# Patient Record
Sex: Male | Born: 1945 | Race: White | Hispanic: No | Marital: Married | Smoking: Never smoker
Health system: Southern US, Community
[De-identification: ages and names within clinical notes are randomized; demographics above are authoritative.]

## PROBLEM LIST (undated history)

## (undated) DIAGNOSIS — F0390 Unspecified dementia without behavioral disturbance: Secondary | ICD-10-CM

## (undated) DIAGNOSIS — N289 Disorder of kidney and ureter, unspecified: Secondary | ICD-10-CM

## (undated) HISTORY — PX: EYE SURGERY: SHX253

## (undated) HISTORY — PX: APPENDECTOMY: SHX54

---

## 2000-07-26 ENCOUNTER — Observation Stay (HOSPITAL_COMMUNITY): Admission: RE | Admit: 2000-07-26 | Discharge: 2000-07-27 | Payer: Self-pay | Admitting: Orthopedic Surgery

## 2005-07-20 ENCOUNTER — Ambulatory Visit (HOSPITAL_COMMUNITY): Admission: RE | Admit: 2005-07-20 | Discharge: 2005-07-20 | Payer: Self-pay | Admitting: *Deleted

## 2010-02-08 ENCOUNTER — Encounter: Admission: RE | Admit: 2010-02-08 | Discharge: 2010-02-08 | Payer: Self-pay | Admitting: Family Medicine

## 2010-08-11 ENCOUNTER — Emergency Department (HOSPITAL_COMMUNITY): Admission: EM | Admit: 2010-08-11 | Discharge: 2010-08-11 | Payer: Self-pay | Admitting: Emergency Medicine

## 2010-10-19 ENCOUNTER — Encounter: Admission: RE | Admit: 2010-10-19 | Discharge: 2010-10-19 | Payer: Self-pay | Admitting: Gastroenterology

## 2011-03-02 LAB — DIFFERENTIAL
Basophils Absolute: 0 10*3/uL (ref 0.0–0.1)
Basophils Relative: 0 % (ref 0–1)
Eosinophils Absolute: 0.1 10*3/uL (ref 0.0–0.7)
Eosinophils Relative: 2 % (ref 0–5)
Lymphs Abs: 1.2 10*3/uL (ref 0.7–4.0)
Monocytes Absolute: 0.5 10*3/uL (ref 0.1–1.0)
Neutrophils Relative %: 75 % (ref 43–77)

## 2011-03-02 LAB — CBC
Hemoglobin: 16 g/dL (ref 13.0–17.0)
MCH: 31.8 pg (ref 26.0–34.0)
MCHC: 34.6 g/dL (ref 30.0–36.0)
MCV: 92 fL (ref 78.0–100.0)
Platelets: 155 10*3/uL (ref 150–400)
RBC: 5.03 MIL/uL (ref 4.22–5.81)
RDW: 13.3 % (ref 11.5–15.5)

## 2011-03-02 LAB — BASIC METABOLIC PANEL
BUN: 16 mg/dL (ref 6–23)
CO2: 30 mEq/L (ref 19–32)
Calcium: 9.3 mg/dL (ref 8.4–10.5)
Chloride: 108 mEq/L (ref 96–112)
Creatinine, Ser: 1.23 mg/dL (ref 0.4–1.5)
GFR calc Af Amer: >60 mL/min (ref >60)
Sodium: 142 mEq/L (ref 135–145)

## 2011-03-02 LAB — POCT CARDIAC MARKERS: Myoglobin, poc: 57 ng/mL (ref 12–200)

## 2011-05-04 NOTE — Op Note (Signed)
NAME:  ALAZAR, CHERIAN NO.:  [id number]   MEDICAL RECORD NO.:  [id number]          PATIENT TYPE:  AMB   LOCATION:  ENDO                         FACILITY:  [facility]   PHYSICIAN:  Georgiana Spinner, M.D.    DATE OF BIRTH:  October 07, 1946   DATE OF PROCEDURE:  07/20/2005  DATE OF DISCHARGE:                                 OPERATIVE REPORT   PROCEDURE:  Colonoscopy.   INDICATIONS:  Colon cancer screening.   ANESTHESIA:  Demerol 80, Versed 7 mg,   PROCEDURE:  With the patient mildly sedated in the left lateral decubitus  position, a rectal examination was performed which was unremarkable.  Subsequently, the Olympus videoscopic colonoscope was inserted into the  rectum and passed under direct vision to the cecum, identified by ileocecal  valve and appendiceal orifice both which were photographed.  From this  point, the colonoscope was slowly withdrawn taking circumferential views of  colonic mucosa stopping in the rectum which appeared normal on direct and  showed hemorrhoids on retroflexed view.  The endoscope was straightened,  withdrawn.  The patient's vital signs and pulse oximeter remained stable.  The patient tolerated the procedure well without apparent complications.   FINDINGS:  Internal hemorrhoids, otherwise unremarkable colonoscopic  examination to the cecum.   PLAN:  Repeat examination possibly in 5-10 years.       GMO/MEDQ  D:  07/20/2005  T:  07/20/2005  Job:  [id number]

## 2011-05-04 NOTE — Op Note (Signed)
[facility]  Patient:    Robert Hester, Robert Hester                      MRN: [id number] Proc. Date: 07/26/00 Adm. Date:  [id number] Attending:  Marlowe Kays Page                           Operative Report  PREOPERATIVE DIAGNOSIS:  Chronic impingement syndrome, right shoulder.  POSTOPERATIVE DIAGNOSIS:  Chronic impingement syndrome, right shoulder.  OPERATION PERFORMED: 1. Right shoulder arthroscopy (normal exam). 2. Arthroscopic SAD.  SURGEON:  Dr. Simonne Come.  ASSISTANT:  Grayland Jack, P.A.  ANESTHESIA:  General.  PATHOLOGY AND JUSTIFICATION FOR PROCEDURE:  He has had progressive persistent symptoms of impingement syndrome, right shoulder associated with an down sloping acromion and an MRI which shows some rotator cuff tendonopathy but no tear or glenohumeral abnormalities. 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  DESCRIPTION OF PROCEDURE:  Satisfactory general anesthesia, beach chair position on the Schlein frame, the right shoulder girdle was prepped with duraprep, draped in a sterile field and the anatomy of the shoulder was marked out and the posterior soft spot located and infiltrated with 0.5% Marcaine with adrenaline as was the subacromial space. I then inserted a blunt trocar atraumatically in the glenohumeral joint which I found to be normal. Representative pictures were taken of the labrum, humeral head, biceps tendon and rotator cuff. I then redirected the scope in the subacromial area and through a lateral portal which I infiltrated also with 0.5% Marcaine with adrenaline, inserted a blunt cannula into the subacromial space followed by a 4.2 shaver. He had a good bit of bursitis present which I gradually resected out with the shaver and shaved the underneath surface of the anterior acromion. I then took a preliminary picture with the shaver in place. I  then took the underwater cautery and cauterized around the perimeter of the acromion and followed this with additional shaving with a 4.2 shaver and then burring with a 4.0 bur. I went back and forth alternating between the shaver, the cautery and the bur until I had what I felt was a complete subacromial decompression. I took a final picture with the arm abducted and flexed with the shaver showing between the rotator cuff and the subacromial area and there was plenty of room. I then evacuated the subacromial joint of all fluid possible and infiltrated it with 0.5% Marcaine with adrenaline. The 2 portals were closed with 4-0 nylon followed by Betadine Adaptic dry sterile dressing and shoulder immobilizer. The patient tolerated the procedure well and was taken to the operating room in satisfactory condition with no known complications. DD:  07/26/00 TD:  07/27/00 Job: [id number]
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
## 2014-12-23 DIAGNOSIS — D225 Melanocytic nevi of trunk: Secondary | ICD-10-CM | POA: Diagnosis not present

## 2014-12-23 DIAGNOSIS — Z85820 Personal history of malignant melanoma of skin: Secondary | ICD-10-CM | POA: Diagnosis not present

## 2014-12-23 DIAGNOSIS — L821 Other seborrheic keratosis: Secondary | ICD-10-CM | POA: Diagnosis not present

## 2014-12-23 DIAGNOSIS — Z08 Encounter for follow-up examination after completed treatment for malignant neoplasm: Secondary | ICD-10-CM | POA: Diagnosis not present

## 2014-12-27 ENCOUNTER — Other Ambulatory Visit: Payer: Self-pay | Admitting: Family Medicine

## 2014-12-27 DIAGNOSIS — Z800 Family history of malignant neoplasm of digestive organs: Secondary | ICD-10-CM

## 2014-12-27 DIAGNOSIS — Q438 Other specified congenital malformations of intestine: Secondary | ICD-10-CM

## 2014-12-31 ENCOUNTER — Other Ambulatory Visit: Payer: Self-pay | Admitting: Family Medicine

## 2014-12-31 DIAGNOSIS — Z800 Family history of malignant neoplasm of digestive organs: Secondary | ICD-10-CM

## 2014-12-31 DIAGNOSIS — Q438 Other specified congenital malformations of intestine: Secondary | ICD-10-CM

## 2015-01-10 ENCOUNTER — Ambulatory Visit
Admission: RE | Admit: 2015-01-10 | Discharge: 2015-01-10 | Disposition: A | Discharge status: Home / Self Care | Payer: Commercial Managed Care - HMO | Source: Ambulatory Visit | Attending: Family Medicine | Admitting: Family Medicine

## 2015-01-10 DIAGNOSIS — Z800 Family history of malignant neoplasm of digestive organs: Secondary | ICD-10-CM

## 2015-01-10 DIAGNOSIS — Z1211 Encounter for screening for malignant neoplasm of colon: Secondary | ICD-10-CM | POA: Diagnosis not present

## 2015-01-10 DIAGNOSIS — Q438 Other specified congenital malformations of intestine: Secondary | ICD-10-CM

## 2015-01-10 DIAGNOSIS — K562 Volvulus: Secondary | ICD-10-CM | POA: Diagnosis not present

## 2015-02-10 DIAGNOSIS — K4090 Unilateral inguinal hernia, without obstruction or gangrene, not specified as recurrent: Secondary | ICD-10-CM | POA: Diagnosis not present

## 2015-06-27 DIAGNOSIS — L821 Other seborrheic keratosis: Secondary | ICD-10-CM | POA: Diagnosis not present

## 2015-06-27 DIAGNOSIS — D225 Melanocytic nevi of trunk: Secondary | ICD-10-CM | POA: Diagnosis not present

## 2015-06-27 DIAGNOSIS — L570 Actinic keratosis: Secondary | ICD-10-CM | POA: Diagnosis not present

## 2015-06-27 DIAGNOSIS — Z08 Encounter for follow-up examination after completed treatment for malignant neoplasm: Secondary | ICD-10-CM | POA: Diagnosis not present

## 2015-06-27 DIAGNOSIS — Z85820 Personal history of malignant melanoma of skin: Secondary | ICD-10-CM | POA: Diagnosis not present

## 2015-11-25 DIAGNOSIS — M545 Low back pain: Secondary | ICD-10-CM | POA: Diagnosis not present

## 2015-12-13 DIAGNOSIS — R1031 Right lower quadrant pain: Secondary | ICD-10-CM | POA: Diagnosis not present

## 2015-12-28 DIAGNOSIS — L812 Freckles: Secondary | ICD-10-CM | POA: Diagnosis not present

## 2015-12-28 DIAGNOSIS — Z85820 Personal history of malignant melanoma of skin: Secondary | ICD-10-CM | POA: Diagnosis not present

## 2015-12-28 DIAGNOSIS — L821 Other seborrheic keratosis: Secondary | ICD-10-CM | POA: Diagnosis not present

## 2015-12-28 DIAGNOSIS — D1801 Hemangioma of skin and subcutaneous tissue: Secondary | ICD-10-CM | POA: Diagnosis not present

## 2016-01-05 DIAGNOSIS — Z1211 Encounter for screening for malignant neoplasm of colon: Secondary | ICD-10-CM | POA: Diagnosis not present

## 2016-01-05 DIAGNOSIS — Z800 Family history of malignant neoplasm of digestive organs: Secondary | ICD-10-CM | POA: Diagnosis not present

## 2016-01-05 DIAGNOSIS — D126 Benign neoplasm of colon, unspecified: Secondary | ICD-10-CM | POA: Diagnosis not present

## 2016-01-05 DIAGNOSIS — D125 Benign neoplasm of sigmoid colon: Secondary | ICD-10-CM | POA: Diagnosis not present

## 2016-01-14 IMAGING — CT CT VIRTUAL COLONOSCOPY DIAGNOSTIC
3 of 7 series · 12 of 36 positions shown, 18 images · non-contrast
Comparison: Barium enema with air of 10/19/2010

CLINICAL DATA: Tortuous colon, screening for colorectal carcinoma,
strong family history of colorectal carcinoma, incomplete
colonoscopy in 4933 due to tortuous colon

EXAM:
CT VIRTUAL COLONOSCOPY DIAGNOSTIC
TECHNIQUE: The patient was given a standard magnesium citrate and suppositories
bowel preparation with Gastrografin and barium for fluid and stool
tagging respectively. The quality of the bowel preparation is
moderate. Automated CO2 insufflation of the colon was performed
prior to image acquisition and colonic distention is moderate. Image
post processing was used to generate a 3D endoluminal fly-through
projection of the colon and to electronically subtract stool/fluid
as appropriate.

[Series 2: supine (id) · axial · 0.79mm/px · z∈[-448,-226]mm · 4 of 415 slices shown]
[im 60/415  soft-tissue]
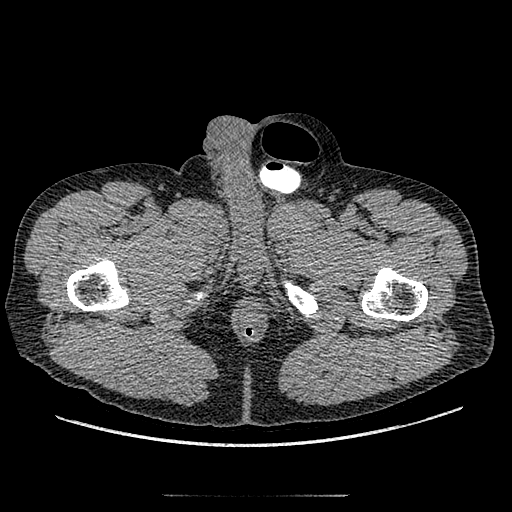
[im 119/415  soft-tissue]
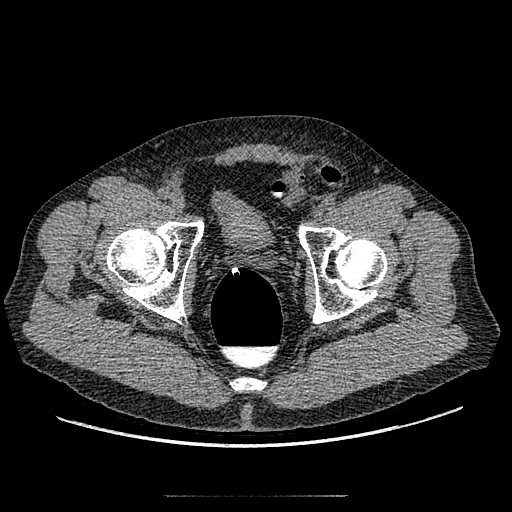
[im 178/415  soft-tissue]
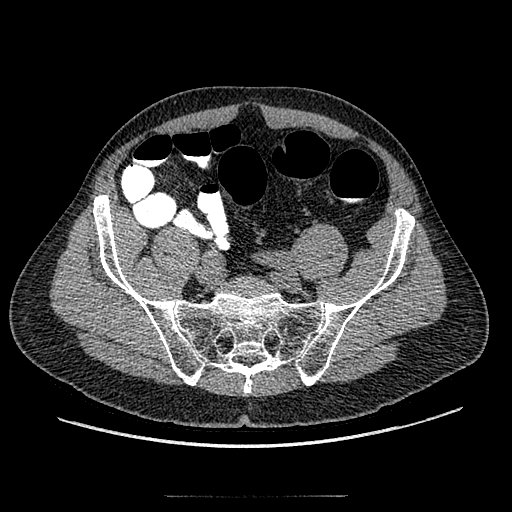
[im 237/415  soft-tissue]
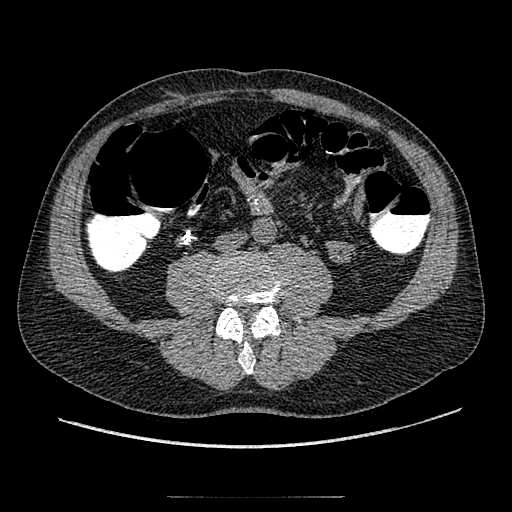

[Series 6: prone (id) · axial · 0.83mm/px · z∈[-436,-33]mm · 7 of 431 slices shown, 12 images]
[im 54/431  soft-tissue]
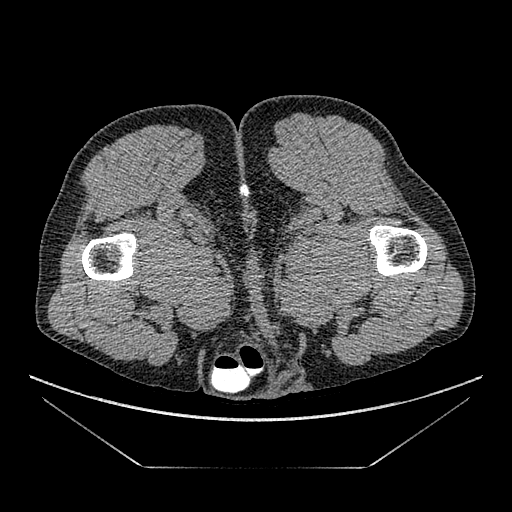
[im 54/431  bone]
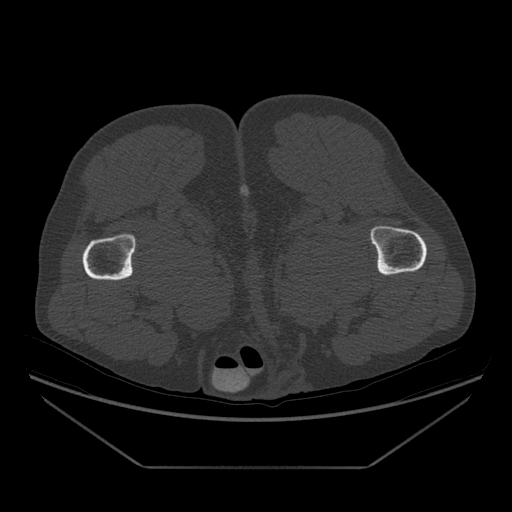
[im 108/431  soft-tissue]
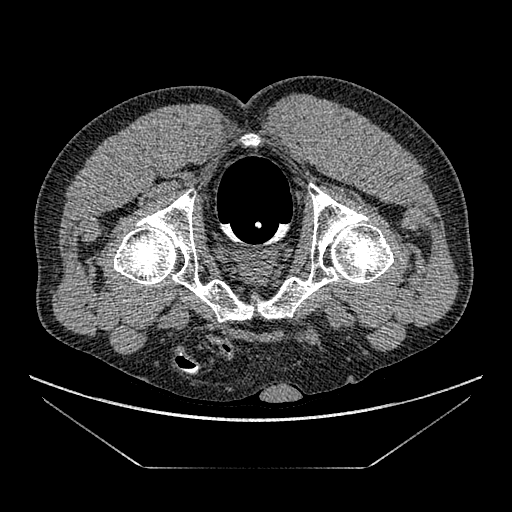
[im 162/431  soft-tissue]
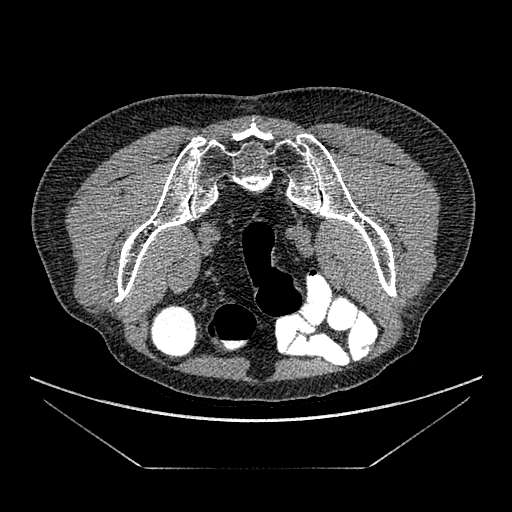
[im 216/431  soft-tissue]
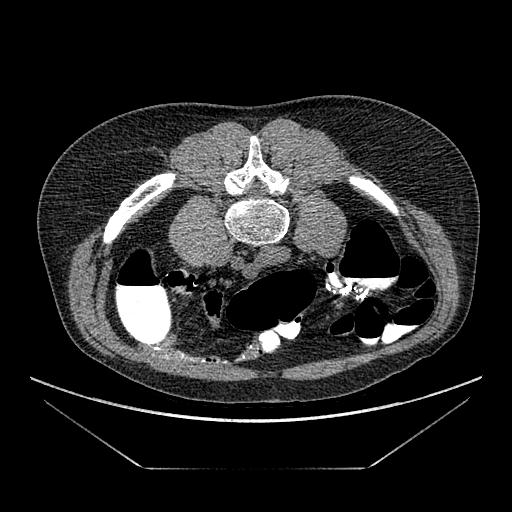
[im 216/431  lung]
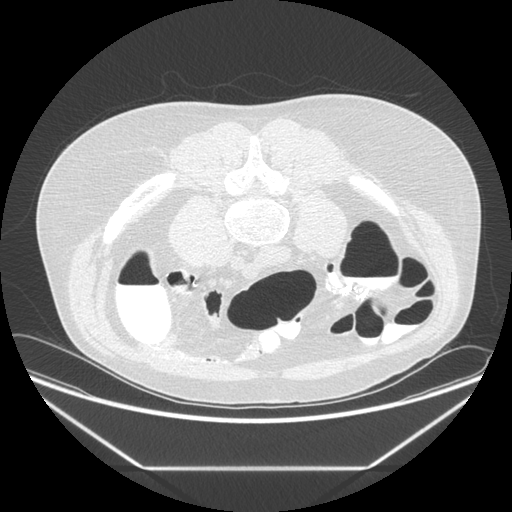
[im 269/431  soft-tissue]
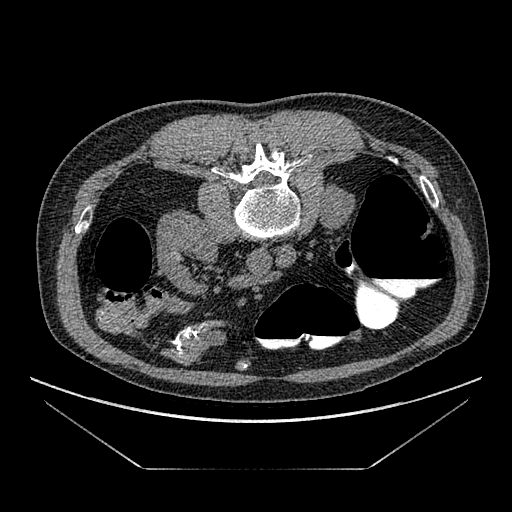
[im 269/431  lung]
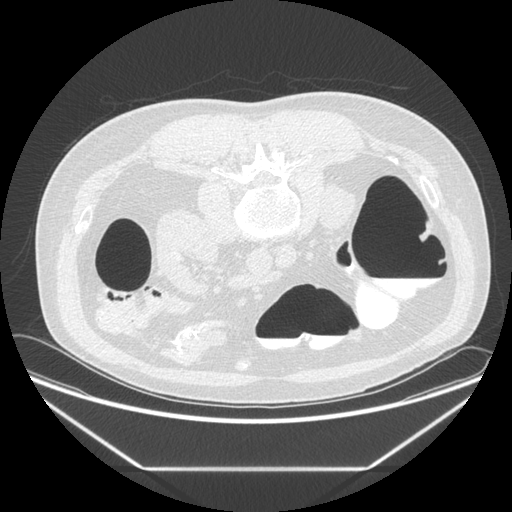
[im 323/431  soft-tissue]
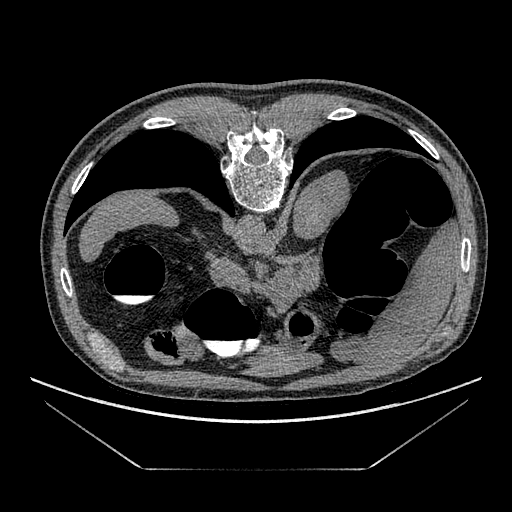
[im 323/431  lung]
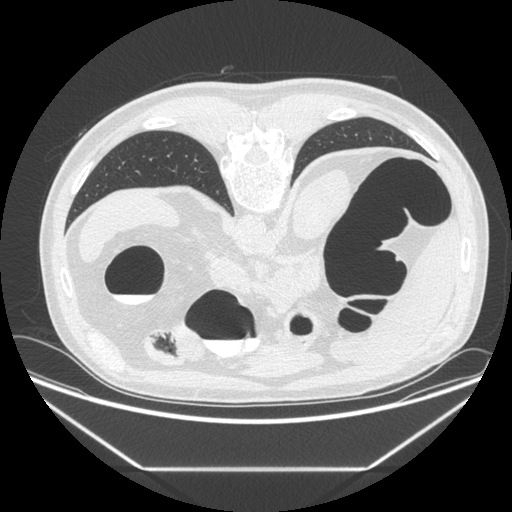
[im 377/431  soft-tissue]
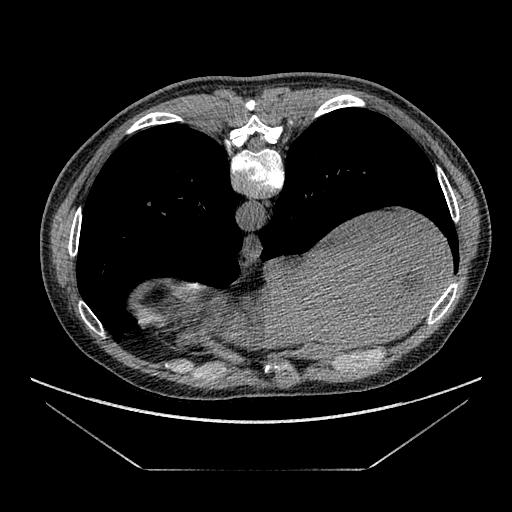
[im 377/431  lung]
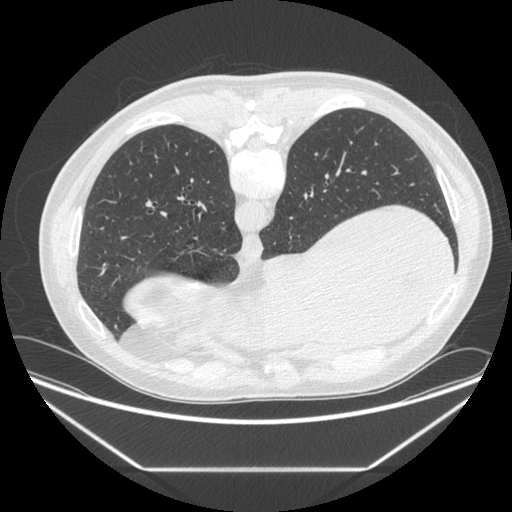

[Series 601: coronal body · coronal · 1.01mm/px · 1 of 120 slices shown, 2 images]
[im 40/120  soft-tissue]
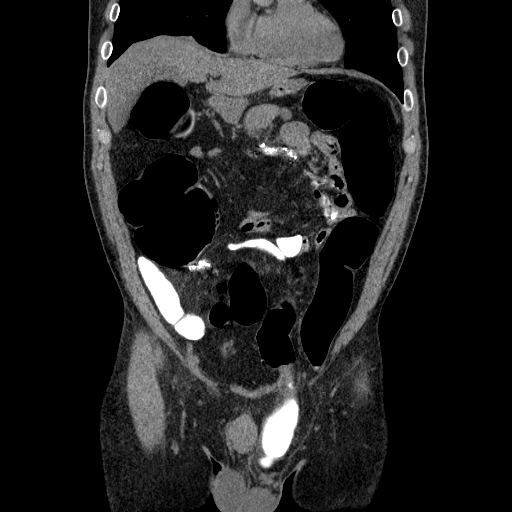
[im 40/120  bone]
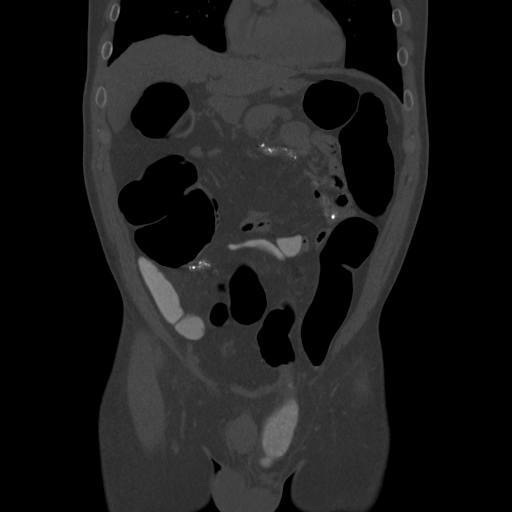

[12 of 36 positions shown; findings below may reference images not displayed]

FINDINGS: Both 2D and 3D images of the colon were performed and reviewed in
prone and supine positions. There is a moderate amount of retained
fluid which is well tagged. Very little retained feces is seen.
Although there are a few small polypoid lesions in the descending
and transverse colon of no more than 3-4 mm in diameter, no
clinically significant polypoid lesion is seen. There is a left
inguinal hernia containing a nondilated loop of the junction of the
distal descending and proximal sigmoid colon. This area is not
optimally visualized but no abnormal soft tissue is evident on the
images obtained. The cecum and ileocecal valve are well visualized
with no abnormality noted.
IMPRESSION: 1. No clinically significant polypoid lesion is seen. Only a few
scattered 3-4 mm polypoid lesions are noted in the descending and
transverse colon of no clinical significance.
2. Left inguinal hernia containing a nondilated loop of rectosigmoid
and distal descending colon.

Virtual colonoscopy is not designed to detect diminutive polyps
(i.e., less than or equal to 5 mm), the presence or absence of which
may not affect clinical management.

CT ABDOMEN AND PELVIS WITHOUT CONTRAST
FINDINGS: The lung bases are clear. There are a few
well-circumscribed low-attenuation liver lesions most consistent
with hepatic cysts, but difficult to assess on this unenhanced
study. No calcified gallstones are seen. The pancreas is
unremarkable in the unenhanced state. The adrenal glands and spleen
are unremarkable. The stomach is decompressed. No renal calculi are
seen and there is no evidence of hydronephrosis. No renal mass is
evident on this unenhanced study. The abdominal aorta is normal in
caliber. No adenopathy is seen.

The urinary bladder is decompressed and cannot be evaluated. Again a
left inguinal hernia is noted containing a nondilated loop of
rectosigmoid colon. The prostate is normal in size. No pelvic mass
or fluid is seen. The appendix is not definitely seen. The lumbar
vertebrae are in normal alignment. The SI joints appear corticated.

1. Left inguinal hernia containing a nondilated loop of rectosigmoid
colon.
2. Probable hepatic cysts, difficult to assess on this unenhanced
study.

## 2016-01-16 DIAGNOSIS — H5210 Myopia, unspecified eye: Secondary | ICD-10-CM | POA: Diagnosis not present

## 2016-01-16 DIAGNOSIS — H5203 Hypermetropia, bilateral: Secondary | ICD-10-CM | POA: Diagnosis not present

## 2016-02-03 DIAGNOSIS — Z13220 Encounter for screening for lipoid disorders: Secondary | ICD-10-CM | POA: Diagnosis not present

## 2016-02-03 DIAGNOSIS — Z1211 Encounter for screening for malignant neoplasm of colon: Secondary | ICD-10-CM | POA: Diagnosis not present

## 2016-02-03 DIAGNOSIS — R413 Other amnesia: Secondary | ICD-10-CM | POA: Diagnosis not present

## 2016-02-03 DIAGNOSIS — Z125 Encounter for screening for malignant neoplasm of prostate: Secondary | ICD-10-CM | POA: Diagnosis not present

## 2016-02-03 DIAGNOSIS — Z23 Encounter for immunization: Secondary | ICD-10-CM | POA: Diagnosis not present

## 2016-02-03 DIAGNOSIS — K4090 Unilateral inguinal hernia, without obstruction or gangrene, not specified as recurrent: Secondary | ICD-10-CM | POA: Diagnosis not present

## 2016-02-03 DIAGNOSIS — Z1389 Encounter for screening for other disorder: Secondary | ICD-10-CM | POA: Diagnosis not present

## 2016-02-03 DIAGNOSIS — Z20820 Contact with and (suspected) exposure to varicella: Secondary | ICD-10-CM | POA: Diagnosis not present

## 2016-02-03 DIAGNOSIS — Z0000 Encounter for general adult medical examination without abnormal findings: Secondary | ICD-10-CM | POA: Diagnosis not present

## 2016-02-10 DIAGNOSIS — Z20820 Contact with and (suspected) exposure to varicella: Secondary | ICD-10-CM | POA: Diagnosis not present

## 2016-02-10 DIAGNOSIS — R413 Other amnesia: Secondary | ICD-10-CM | POA: Diagnosis not present

## 2016-02-10 DIAGNOSIS — Z125 Encounter for screening for malignant neoplasm of prostate: Secondary | ICD-10-CM | POA: Diagnosis not present

## 2016-02-10 DIAGNOSIS — Z136 Encounter for screening for cardiovascular disorders: Secondary | ICD-10-CM | POA: Diagnosis not present

## 2016-03-05 ENCOUNTER — Encounter: Payer: Self-pay | Admitting: Neurology

## 2016-03-05 ENCOUNTER — Ambulatory Visit (INDEPENDENT_AMBULATORY_CARE_PROVIDER_SITE_OTHER): Payer: Commercial Managed Care - HMO | Admitting: Neurology

## 2016-03-05 ENCOUNTER — Telehealth: Payer: Self-pay

## 2016-03-05 ENCOUNTER — Other Ambulatory Visit (INDEPENDENT_AMBULATORY_CARE_PROVIDER_SITE_OTHER): Payer: Commercial Managed Care - HMO

## 2016-03-05 VITALS — BP 106/60 | HR 80 | Ht 68.0 in | Wt 191.0 lb

## 2016-03-05 DIAGNOSIS — R413 Other amnesia: Secondary | ICD-10-CM | POA: Diagnosis not present

## 2016-03-05 DIAGNOSIS — F419 Anxiety disorder, unspecified: Secondary | ICD-10-CM | POA: Diagnosis not present

## 2016-03-05 LAB — VITAMIN B12: Vitamin B-12: 349 pg/mL (ref 211–911)

## 2016-03-05 LAB — TSH: TSH: 2.65 u[IU]/mL (ref 0.35–4.50)
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
## 2016-03-05 NOTE — Patient Instructions (Signed)
1.  We will check MRI of brain as well as B12 and TSH 2.  I would recommend seeing a therapist regarding the stress 3.  I would monitor for now and recheck in 9 months.

## 2016-03-05 NOTE — Telephone Encounter (Signed)
Message relayed to patient. Verbalized understanding and denied questions.   

## 2016-03-05 NOTE — Progress Notes (Signed)
Chart forwarded.  

## 2016-03-05 NOTE — Telephone Encounter (Signed)
-----   Message from Pieter Partridge, DO sent at 03/05/2016  1:33 PM EDT ----- Labs are normal

## 2016-03-10 ENCOUNTER — Inpatient Hospital Stay: Admission: RE | Admit: 2016-03-10 | Payer: Commercial Managed Care - HMO | Source: Ambulatory Visit

## 2016-03-22 DIAGNOSIS — K4090 Unilateral inguinal hernia, without obstruction or gangrene, not specified as recurrent: Secondary | ICD-10-CM | POA: Diagnosis not present

## 2016-07-16 DIAGNOSIS — L814 Other melanin hyperpigmentation: Secondary | ICD-10-CM | POA: Diagnosis not present

## 2016-07-16 DIAGNOSIS — B351 Tinea unguium: Secondary | ICD-10-CM | POA: Diagnosis not present

## 2016-07-16 DIAGNOSIS — Z85820 Personal history of malignant melanoma of skin: Secondary | ICD-10-CM | POA: Diagnosis not present

## 2016-07-16 DIAGNOSIS — D235 Other benign neoplasm of skin of trunk: Secondary | ICD-10-CM | POA: Diagnosis not present

## 2016-07-16 DIAGNOSIS — L821 Other seborrheic keratosis: Secondary | ICD-10-CM | POA: Diagnosis not present

## 2016-10-18 DIAGNOSIS — K4090 Unilateral inguinal hernia, without obstruction or gangrene, not specified as recurrent: Secondary | ICD-10-CM | POA: Diagnosis not present

## 2016-12-05 ENCOUNTER — Encounter: Payer: Self-pay | Admitting: Neurology

## 2016-12-05 ENCOUNTER — Ambulatory Visit (INDEPENDENT_AMBULATORY_CARE_PROVIDER_SITE_OTHER): Payer: Commercial Managed Care - HMO | Admitting: Neurology

## 2016-12-05 VITALS — BP 132/68 | HR 84 | Ht 68.0 in | Wt 190.0 lb

## 2016-12-05 DIAGNOSIS — R413 Other amnesia: Secondary | ICD-10-CM | POA: Diagnosis not present

## 2016-12-05 NOTE — Progress Notes (Signed)
Chart forwarded.  

## 2016-12-05 NOTE — Patient Instructions (Signed)
1.  We will set you up for neurocognitive testing.  Follow up afterwards 2.  Recommend Mediterranean diet  Mediterranean Diet A Mediterranean diet refers to food and lifestyle choices that are based on the traditions of countries located on the [employer]. This way of eating has been shown to help prevent certain conditions and improve outcomes for people who have chronic diseases, like kidney disease and heart disease. What are tips for following this plan? Lifestyle  Cook and eat meals together with your family, when possible.  Drink enough fluid to keep your urine clear or pale yellow.  Be physically active every day. This includes:  Aerobic exercise like running or swimming.  Leisure activities like gardening, walking, or housework.  Get 7-8 hours of sleep each night.  If recommended by your health care provider, drink red wine in moderation. This means 1 glass a day for nonpregnant women and 2 glasses a day for men. A glass of wine equals 5 oz (150 mL). Reading food labels  Check the serving size of packaged foods. For foods such as rice and pasta, the serving size refers to the amount of cooked product, not dry.  Check the total fat in packaged foods. Avoid foods that have saturated fat or trans fats.  Check the ingredients list for added sugars, such as corn syrup. Shopping  At the grocery store, buy most of your food from the areas near the walls of the store. This includes:  Fresh fruits and vegetables (produce).  Grains, beans, nuts, and seeds. Some of these may be available in unpackaged forms or large amounts (in bulk).  Fresh seafood.  Poultry and eggs.  Low-fat dairy products.  Buy whole ingredients instead of prepackaged foods.  Buy fresh fruits and vegetables in-season from local farmers markets.  Buy frozen fruits and vegetables in resealable bags.  If you do not have access to quality fresh seafood, buy precooked frozen shrimp or canned fish,  such as tuna, salmon, or sardines.  Buy small amounts of raw or cooked vegetables, salads, or olives from the deli or salad bar at your store.  Stock your pantry so you always have certain foods on hand, such as olive oil, canned tuna, canned tomatoes, rice, pasta, and beans. Cooking  Cook foods with extra-virgin olive oil instead of using butter or other vegetable oils.  Have meat as a side dish, and have vegetables or grains as your main dish. This means having meat in small portions or adding small amounts of meat to foods like pasta or stew.  Use beans or vegetables instead of meat in common dishes like chili or lasagna.  Experiment with different cooking methods. Try roasting or broiling vegetables instead of steaming or sauteing them.  Add frozen vegetables to soups, stews, pasta, or rice.  Add nuts or seeds for added healthy fat at each meal. You can add these to yogurt, salads, or vegetable dishes.  Marinate fish or vegetables using olive oil, lemon juice, garlic, and fresh herbs. Meal planning  Plan to eat 1 vegetarian meal one day each week. Try to work up to 2 vegetarian meals, if possible.  Eat seafood 2 or more times a week.  Have healthy snacks readily available, such as:  Vegetable sticks with hummus.  Greek yogurt.  Fruit and nut trail mix.  Eat balanced meals throughout the week. This includes:  Fruit: 2-3 servings a day  Vegetables: 4-5 servings a day  Low-fat dairy: 2 servings a day  Fish,  poultry, or lean meat: 1 serving a day  Beans and legumes: 2 or more servings a week  Nuts and seeds: 1-2 servings a day  Whole grains: 6-8 servings a day  Extra-virgin olive oil: 3-4 servings a day  Limit red meat and sweets to only a few servings a month What are my food choices?  Mediterranean diet  Recommended  Grains: Whole-grain pasta. Brown rice. Bulgar wheat. Polenta. Couscous. Whole-wheat bread. [place].  Vegetables: Artichokes.  Beets. Broccoli. Cabbage. Carrots. Eggplant. Green beans. Chard. Kale. Spinach. Onions. Leeks. Peas. Squash. Tomatoes. Peppers. Radishes.  Fruits: Apples. Apricots. Avocado. Berries. Bananas. Cherries. Dates. Figs. Grapes. Lemons. Melon. Oranges. Peaches. Plums. Pomegranate.  Meats and other protein foods: Beans. Almonds. Sunflower seeds. Pine nuts. Peanuts. [place]. Salmon. Scallops. Shrimp. [place]. Tilapia. Clams. Oysters. Eggs.  Dairy: Low-fat milk. Cheese. Greek yogurt.  Beverages: Water. Red wine. Herbal tea.  Fats and oils: Extra virgin olive oil. Avocado oil. Grape seed oil.  Sweets and desserts: [place] yogurt with honey. Baked apples. Poached pears. Trail mix.  Seasoning and other foods: Basil. Cilantro. Coriander. Cumin. Mint. Parsley. Sage. Rosemary. Tarragon. Garlic. Oregano. Thyme. Pepper. Balsalmic vinegar. Tahini. Hummus. Tomato sauce. Olives. Mushrooms.  Limit these  Grains: Prepackaged pasta or rice dishes. Prepackaged cereal with added sugar.  Vegetables: Deep fried potatoes (french fries).  Fruits: Fruit canned in syrup.  Meats and other protein foods: Beef. Pork. Lamb. Poultry with skin. Hot dogs. Berniece Salines.  Dairy: Ice cream. Sour cream. Whole milk.  Beverages: Juice. Sugar-sweetened soft drinks. Beer. Liquor and spirits.  Fats and oils: Butter. Canola oil. Vegetable oil. Beef fat (tallow). Lard.  Sweets and desserts: Cookies. Cakes. Pies. Candy.  Seasoning and other foods: Mayonnaise. Premade sauces and marinades.  The items listed may not be a complete list. Talk with your dietitian about what dietary choices are right for you. Summary  The Mediterranean diet includes both food and lifestyle choices.  Eat a variety of fresh fruits and vegetables, beans, nuts, seeds, and whole grains.  Limit the amount of red meat and sweets that you eat.  Talk with your health care provider about whether it is safe for you to drink red wine in moderation. This means 1 glass a  day for nonpregnant women and 2 glasses a day for men. A glass of wine equals 5 oz (150 mL). This information is not intended to replace advice given to you by your health care provider. Make sure you discuss any questions you have with your health care provider. Document Released: 07/26/2016 Document Revised: 08/28/2016 Document Reviewed: 07/26/2016 Elsevier Interactive Patient Education  2017 [employer].
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
## 2017-01-21 ENCOUNTER — Ambulatory Visit (INDEPENDENT_AMBULATORY_CARE_PROVIDER_SITE_OTHER): Payer: Medicare HMO | Admitting: Psychology

## 2017-01-21 ENCOUNTER — Encounter: Payer: Self-pay | Admitting: Psychology

## 2017-01-21 DIAGNOSIS — R413 Other amnesia: Secondary | ICD-10-CM

## 2017-01-21 DIAGNOSIS — H524 Presbyopia: Secondary | ICD-10-CM | POA: Diagnosis not present
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
## 2017-01-30 ENCOUNTER — Ambulatory Visit (INDEPENDENT_AMBULATORY_CARE_PROVIDER_SITE_OTHER): Payer: Medicare HMO | Admitting: Psychology

## 2017-01-30 DIAGNOSIS — R413 Other amnesia: Secondary | ICD-10-CM

## 2017-01-30 NOTE — Progress Notes (Signed)
   Neuropsychology Note  Robert Hester returned today for 2 hours of neuropsychological testing with technician, Milana Kidney, BS, under the supervision of Dr. Macarthur Critchley. The patient did not appear overtly distressed by the testing session, per behavioral observation or via self-report to the technician. Rest breaks were offered. Robert Hester will return within 2 weeks for a feedback session with Dr. Si Raider at which time his test performances, clinical impressions and treatment recommendations will be reviewed in detail. The patient understands he can contact our office should he require our assistance before this time.  Full report to follow.

## 2017-02-08 DIAGNOSIS — Z23 Encounter for immunization: Secondary | ICD-10-CM | POA: Diagnosis not present

## 2017-02-08 DIAGNOSIS — Z13220 Encounter for screening for lipoid disorders: Secondary | ICD-10-CM | POA: Diagnosis not present

## 2017-02-08 DIAGNOSIS — Z1389 Encounter for screening for other disorder: Secondary | ICD-10-CM | POA: Diagnosis not present

## 2017-02-08 DIAGNOSIS — R413 Other amnesia: Secondary | ICD-10-CM | POA: Diagnosis not present

## 2017-02-08 DIAGNOSIS — Z0000 Encounter for general adult medical examination without abnormal findings: Secondary | ICD-10-CM | POA: Diagnosis not present

## 2017-02-08 DIAGNOSIS — Z1211 Encounter for screening for malignant neoplasm of colon: Secondary | ICD-10-CM | POA: Diagnosis not present

## 2017-02-08 DIAGNOSIS — Z131 Encounter for screening for diabetes mellitus: Secondary | ICD-10-CM | POA: Diagnosis not present

## 2017-02-08 DIAGNOSIS — Z125 Encounter for screening for malignant neoplasm of prostate: Secondary | ICD-10-CM | POA: Diagnosis not present
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
## 2017-02-19 ENCOUNTER — Encounter: Payer: Self-pay | Admitting: Psychology

## 2017-02-19 ENCOUNTER — Ambulatory Visit (INDEPENDENT_AMBULATORY_CARE_PROVIDER_SITE_OTHER): Payer: Medicare HMO | Admitting: Psychology

## 2017-02-19 DIAGNOSIS — R413 Other amnesia: Secondary | ICD-10-CM | POA: Diagnosis not present

## 2017-02-19 DIAGNOSIS — G3184 Mild cognitive impairment, so stated: Secondary | ICD-10-CM

## 2017-02-19 NOTE — Patient Instructions (Signed)
Results of your cognitive evaluation were largely within normal limits for your age, with some areas far exceeding the average range. However, you did have some difficulty with learning and remembering new verbal information. Your test results and current level of functioning clearly do NOT warrant a diagnosis of dementia. However, a diagnosis of Mild Cognitive Impairment (MCI) is appropriate at this time. I have given you written information on MCI.   I have the following recommendations:  --Continue cardiovascular exercise, mental stimulation and social interaction for brain health. You are doing an amazing job maintaining these activities! --Compensatory strategies for memory deficits (e.g., reminders/alerts, calendars, daily logs) will be helpful. Visual memory is better than auditory memory, per your testing results. --Reduced hearing may contribute to inability to encode and recall information. If you do no already wear hearing aids, you may want to see an audiologist. --You could consider starting a medication to help stabilize memory function and reduce the risk of decline. You can talk to Dr. Tomi Likens about this. --I'd like to see you back in one year for re-evaluation so we can monitor over time.

## 2017-02-26 ENCOUNTER — Encounter: Payer: Self-pay | Admitting: Neurology

## 2017-02-26 ENCOUNTER — Other Ambulatory Visit (INDEPENDENT_AMBULATORY_CARE_PROVIDER_SITE_OTHER): Payer: Medicare HMO

## 2017-02-26 ENCOUNTER — Ambulatory Visit (INDEPENDENT_AMBULATORY_CARE_PROVIDER_SITE_OTHER): Payer: Medicare HMO | Admitting: Neurology

## 2017-02-26 VITALS — BP 110/62 | HR 61 | Ht 68.0 in | Wt 190.9 lb

## 2017-02-26 DIAGNOSIS — G3184 Mild cognitive impairment, so stated: Secondary | ICD-10-CM

## 2017-02-26 LAB — VITAMIN B12: Vitamin B-12: 553 pg/mL (ref 211–911)
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
## 2017-02-26 NOTE — Patient Instructions (Signed)
1.  We will check MRI of brain and repeat vitamin B12 level 2.  Continue learning new information (reading the paper/news, documentaries) 3.  Mediterranean diet 4.  Continue exercising 5.  Consider starting donepezil (Aricept) to help with memory 6.  Follow up in 9 months or as needed.

## 2017-02-27 ENCOUNTER — Telehealth: Payer: Self-pay

## 2017-02-27 NOTE — Telephone Encounter (Signed)
-----   Message from Pieter Partridge, DO sent at 02/26/2017  6:48 PM EDT ----- B12 level is ok

## 2017-02-27 NOTE — Telephone Encounter (Signed)
Called patient. Gave lab results. Patient verbalized understanding.  

## 2017-03-07 ENCOUNTER — Other Ambulatory Visit: Payer: Medicare HMO

## 2017-04-01 DIAGNOSIS — H6120 Impacted cerumen, unspecified ear: Secondary | ICD-10-CM | POA: Diagnosis not present

## 2017-07-15 DIAGNOSIS — D692 Other nonthrombocytopenic purpura: Secondary | ICD-10-CM | POA: Diagnosis not present

## 2017-07-15 DIAGNOSIS — B078 Other viral warts: Secondary | ICD-10-CM | POA: Diagnosis not present

## 2017-07-15 DIAGNOSIS — L821 Other seborrheic keratosis: Secondary | ICD-10-CM | POA: Diagnosis not present

## 2017-07-15 DIAGNOSIS — Z85820 Personal history of malignant melanoma of skin: Secondary | ICD-10-CM | POA: Diagnosis not present

## 2017-07-15 DIAGNOSIS — D225 Melanocytic nevi of trunk: Secondary | ICD-10-CM | POA: Diagnosis not present

## 2017-07-15 DIAGNOSIS — D1801 Hemangioma of skin and subcutaneous tissue: Secondary | ICD-10-CM | POA: Diagnosis not present

## 2017-11-29 ENCOUNTER — Ambulatory Visit: Payer: Medicare HMO | Admitting: Neurology

## 2017-12-30 DIAGNOSIS — H524 Presbyopia: Secondary | ICD-10-CM | POA: Diagnosis not present

## 2018-02-10 DIAGNOSIS — Z1159 Encounter for screening for other viral diseases: Secondary | ICD-10-CM | POA: Diagnosis not present

## 2018-02-10 DIAGNOSIS — R413 Other amnesia: Secondary | ICD-10-CM | POA: Diagnosis not present

## 2018-02-10 DIAGNOSIS — Z0000 Encounter for general adult medical examination without abnormal findings: Secondary | ICD-10-CM | POA: Diagnosis not present

## 2018-02-10 DIAGNOSIS — Z125 Encounter for screening for malignant neoplasm of prostate: Secondary | ICD-10-CM | POA: Diagnosis not present

## 2018-02-10 DIAGNOSIS — H02883 Meibomian gland dysfunction of right eye, unspecified eyelid: Secondary | ICD-10-CM | POA: Diagnosis not present

## 2018-02-10 DIAGNOSIS — K4090 Unilateral inguinal hernia, without obstruction or gangrene, not specified as recurrent: Secondary | ICD-10-CM | POA: Diagnosis not present

## 2018-02-10 DIAGNOSIS — D696 Thrombocytopenia, unspecified: Secondary | ICD-10-CM | POA: Diagnosis not present

## 2018-02-10 DIAGNOSIS — H02885 Meibomian gland dysfunction left lower eyelid: Secondary | ICD-10-CM | POA: Diagnosis not present

## 2018-02-17 ENCOUNTER — Ambulatory Visit: Payer: Medicare HMO | Admitting: Psychology

## 2018-02-17 DIAGNOSIS — Z13220 Encounter for screening for lipoid disorders: Secondary | ICD-10-CM | POA: Diagnosis not present

## 2018-02-17 DIAGNOSIS — Z136 Encounter for screening for cardiovascular disorders: Secondary | ICD-10-CM | POA: Diagnosis not present

## 2018-02-17 DIAGNOSIS — D696 Thrombocytopenia, unspecified: Secondary | ICD-10-CM | POA: Diagnosis not present

## 2018-02-20 ENCOUNTER — Ambulatory Visit: Payer: Medicare HMO | Admitting: Neurology

## 2018-03-03 ENCOUNTER — Ambulatory Visit (INDEPENDENT_AMBULATORY_CARE_PROVIDER_SITE_OTHER): Payer: Medicare HMO | Admitting: Neurology

## 2018-03-03 ENCOUNTER — Encounter: Payer: Self-pay | Admitting: Neurology

## 2018-03-03 VITALS — BP 108/72 | HR 69 | Resp 16 | Ht 68.0 in | Wt 189.0 lb

## 2018-03-03 DIAGNOSIS — G301 Alzheimer's disease with late onset: Secondary | ICD-10-CM | POA: Diagnosis not present

## 2018-03-03 DIAGNOSIS — F0280 Dementia in other diseases classified elsewhere without behavioral disturbance: Secondary | ICD-10-CM | POA: Diagnosis not present

## 2018-03-03 MED ORDER — DONEPEZIL HCL 5 MG PO TABS: 5.0000 mg | ORAL_TABLET | Freq: Every day | ORAL | 0 refills | Status: DC

## 2018-03-03 NOTE — Patient Instructions (Addendum)
1. We will start donepezil (Aricept) 5mg  daily for four weeks.  If you are tolerating the medication, then after four weeks, we will increase the dose to 10mg  daily.  Side effects include nausea, vomiting, diarrhea, vivid dreams, and muscle cramps.  Please call the clinic if you experience any of these symptoms. 2.  Follow up with Dr. Si Raider as scheduled and then with me afterward. 3.  In the meantime, we will need to get MRI of brain
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
## 2018-03-12 ENCOUNTER — Telehealth: Payer: Self-pay | Admitting: Neurology

## 2018-03-12 DIAGNOSIS — H02883 Meibomian gland dysfunction of right eye, unspecified eyelid: Secondary | ICD-10-CM | POA: Diagnosis not present

## 2018-03-12 DIAGNOSIS — H02885 Meibomian gland dysfunction left lower eyelid: Secondary | ICD-10-CM | POA: Diagnosis not present

## 2018-03-12 NOTE — Telephone Encounter (Signed)
Sent through system fax

## 2018-03-12 NOTE — Telephone Encounter (Signed)
Robert Hester with [facility] (Dr. Laqueta Linden office) she is needing faxed [phone] his office note from last visit 03/03/18. Thanks

## 2018-04-10 ENCOUNTER — Encounter: Payer: Self-pay | Admitting: Neurology

## 2018-05-27 ENCOUNTER — Ambulatory Visit: Payer: Medicare HMO | Admitting: Neurology

## 2018-05-27 ENCOUNTER — Encounter

## 2018-07-14 ENCOUNTER — Ambulatory Visit: Payer: Medicare HMO | Admitting: Psychology

## 2018-07-14 ENCOUNTER — Encounter: Payer: Self-pay | Admitting: Psychology

## 2018-07-14 ENCOUNTER — Ambulatory Visit (INDEPENDENT_AMBULATORY_CARE_PROVIDER_SITE_OTHER): Payer: Medicare HMO | Admitting: Psychology

## 2018-07-14 DIAGNOSIS — G3184 Mild cognitive impairment, so stated: Secondary | ICD-10-CM

## 2018-07-14 DIAGNOSIS — R413 Other amnesia: Secondary | ICD-10-CM

## 2018-07-14 NOTE — Progress Notes (Signed)
   Neuropsychology Note  Robert Hester completed 60 minutes of neuropsychological testing with technician, Milana Kidney, BS, under the supervision of Dr. Macarthur Critchley, Licensed Psychologist. The patient did not appear overtly distressed by the testing session, per behavioral observation or via self-report to the technician. Rest breaks were offered.   Clinical Decision Making: In considering the patient's current level of functioning, level of presumed impairment, nature of symptoms, emotional and behavioral responses during the interview, level of literacy, and observed level of motivation/effort, a battery of tests was selected and communicated to the psychometrician.  Communication between the psychologist and technician was ongoing throughout the testing session and changes were made as deemed necessary based on patient performance on testing, technician observations and additional pertinent factors such as those listed above.  Robert Hester will return within approximately 2 weeks for an interactive feedback session with Dr. Si Raider at which time his test performances, clinical impressions and treatment recommendations will be reviewed in detail. The patient understands he can contact our office should he require our assistance before this time.  35 minutes spent performing neuropsychological evaluation services/clinical decision making (psychologist). [CPT 34196] 60 minutes spent face-to-face with patient administering standardized tests, 30 minutes spent scoring (technician). [CPT [id number], 22297]  Full report to follow.
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
## 2018-07-16 DIAGNOSIS — Z85820 Personal history of malignant melanoma of skin: Secondary | ICD-10-CM | POA: Diagnosis not present

## 2018-07-16 DIAGNOSIS — L814 Other melanin hyperpigmentation: Secondary | ICD-10-CM | POA: Diagnosis not present

## 2018-07-16 DIAGNOSIS — L570 Actinic keratosis: Secondary | ICD-10-CM | POA: Diagnosis not present

## 2018-07-16 DIAGNOSIS — D229 Melanocytic nevi, unspecified: Secondary | ICD-10-CM | POA: Diagnosis not present

## 2018-07-16 DIAGNOSIS — L821 Other seborrheic keratosis: Secondary | ICD-10-CM | POA: Diagnosis not present

## 2018-07-29 ENCOUNTER — Encounter: Payer: Medicare HMO | Admitting: Psychology
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
## 2018-08-04 ENCOUNTER — Ambulatory Visit (INDEPENDENT_AMBULATORY_CARE_PROVIDER_SITE_OTHER): Payer: Medicare HMO | Admitting: Neurology

## 2018-08-04 ENCOUNTER — Encounter: Payer: Self-pay | Admitting: Neurology

## 2018-08-04 ENCOUNTER — Telehealth: Payer: Self-pay | Admitting: Neurology

## 2018-08-04 VITALS — BP 108/72 | HR 67 | Ht 68.0 in | Wt 180.0 lb

## 2018-08-04 DIAGNOSIS — G3184 Mild cognitive impairment, so stated: Secondary | ICD-10-CM

## 2018-08-04 MED ORDER — DONEPEZIL HCL 5 MG PO TABS: 5.0000 mg | ORAL_TABLET | Freq: Every day | ORAL | 0 refills | Status: DC

## 2018-08-04 NOTE — Addendum Note (Signed)
Addended by: Clois Comber on: 08/04/2018 04:19 PM   Modules accepted: Orders

## 2018-08-04 NOTE — Patient Instructions (Signed)
1. Take donepezil 5mg  at bedtime.  At the end of the month, contact me and I will refill the prescription with 10mg  at bedtime 2.  Follow up with Dr. Si Raider 3.  Follow up with me in 6 months (sooner if testing reveals anything new or unexpected) 4.  Let me know when you would like to order MRI of brain

## 2018-08-04 NOTE — Telephone Encounter (Signed)
Patient's wife called and left a voicemail stating that the MRI orders from March are inactive. She stated that her husband came in this morning and MRI testing was ordered for him. If you could please put in another authorization for the MRI orders to [facility]. If you need to call her its [phone]. Thanks.

## 2018-08-05 ENCOUNTER — Telehealth: Payer: Self-pay | Admitting: Neurology

## 2018-08-05 NOTE — Telephone Encounter (Signed)
Patient is needing another authorization done for MRI that Dr.Jaffe requested. Also, patient had questions regarding ARICEPT medication. Prefer the generic if possible. Wanting to know if Dr. Durene Cal to pharmacy. [facility] at [address] Can you please call them at [phone] or [phone]. Thanks.
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
## 2018-08-06 NOTE — Telephone Encounter (Signed)
Have attempted to reach Pt at [phone], it rings many times then sounds like a fax machine. I tried the [phone], but the VM is full and I am unable to leave a message. The Aricept was sent in to [facility] on [address] and is available in a generic, donepezil. The PA department is working on getting the MRI approved.

## 2018-08-07 ENCOUNTER — Encounter: Payer: Self-pay | Admitting: Psychology

## 2018-08-07 ENCOUNTER — Ambulatory Visit (INDEPENDENT_AMBULATORY_CARE_PROVIDER_SITE_OTHER): Payer: Medicare HMO | Admitting: Psychology

## 2018-08-07 ENCOUNTER — Telehealth: Payer: Self-pay

## 2018-08-07 ENCOUNTER — Encounter

## 2018-08-07 DIAGNOSIS — F0280 Dementia in other diseases classified elsewhere without behavioral disturbance: Secondary | ICD-10-CM | POA: Diagnosis not present

## 2018-08-07 DIAGNOSIS — G301 Alzheimer's disease with late onset: Secondary | ICD-10-CM

## 2018-08-07 NOTE — Telephone Encounter (Signed)
-----   Message from Kandis Nab, PsyD sent at 08/07/2018  1:28 PM EDT ----- Regarding: Rx and MRI I saw this patient for follow-up to review re-evaluation results today. He states that his pharmacy did not get the script for donepezil (however, he cannot be sure about this, due to his memory problem). I advised that I see it was ordered, and they confirmed it is the right pharmacy. They are going to check at the pharmacy but if it's not there they request it be sent again.  His wife states she found out it would only be a $35 copay for brain MRI so they'd like to go ahead and do that. I advised I see order was placed. The patient does not think he has been called (but again, he has significant memory loss). His wife is requesting she be called to schedule that appt. Her cell is [phone].  Thank you!

## 2018-08-07 NOTE — Telephone Encounter (Signed)
[facility], spoke with Elmo Putt, she confirmed Aricept was filled on 08/04/18 and is ready for pick up.  Called [facility], spoke with Lindley Magnus, provided her with Pt's wife's call phone number to call and schedule MRI.

## 2018-09-09 ENCOUNTER — Telehealth: Payer: Self-pay | Admitting: Neurology

## 2018-09-09 DIAGNOSIS — F0280 Dementia in other diseases classified elsewhere without behavioral disturbance: Secondary | ICD-10-CM

## 2018-09-09 DIAGNOSIS — G301 Alzheimer's disease with late onset: Principal | ICD-10-CM

## 2018-09-09 MED ORDER — DONEPEZIL HCL 10 MG PO TABS: 10.0000 mg | ORAL_TABLET | Freq: Every day | ORAL | 5 refills | Status: DC

## 2018-09-09 NOTE — Telephone Encounter (Signed)
Patient is calling in to get a prescription sent in for his Donepezil medication. He is still using the [facility] on [address]. He said that the medication was working well and if needed for an increase in the medication he was okay with that. The doctor just wanted him to try it out and see how it did. If you need to call him back it's [phone]. Thanks!

## 2018-09-09 NOTE — Telephone Encounter (Signed)
Called and spoke with Pt's wife, Katharine Look. Pt has been tolerating donepezil 5 mg. Am sending in Rx for 10 mg.

## 2018-12-18 ENCOUNTER — Other Ambulatory Visit: Payer: Self-pay | Admitting: Neurology

## 2018-12-18 DIAGNOSIS — F0280 Dementia in other diseases classified elsewhere without behavioral disturbance: Secondary | ICD-10-CM

## 2018-12-18 DIAGNOSIS — G301 Alzheimer's disease with late onset: Principal | ICD-10-CM

## 2019-02-04 ENCOUNTER — Ambulatory Visit: Payer: Medicare HMO | Admitting: Neurology

## 2019-05-29 ENCOUNTER — Telehealth: Payer: Self-pay | Admitting: Neurology

## 2019-05-29 NOTE — Telephone Encounter (Signed)
Pt's wife called to inform that pt is scheduled for an MRI brain on 6/18 at [facility] at  12pm. The hospital told them to contact us to find out if PA is needed from pt's insurance. Pls call pt.

## 2019-06-01 ENCOUNTER — Other Ambulatory Visit: Payer: Self-pay

## 2019-06-01 ENCOUNTER — Telehealth: Payer: Self-pay

## 2019-06-01 DIAGNOSIS — G3184 Mild cognitive impairment, so stated: Secondary | ICD-10-CM

## 2019-06-01 NOTE — Telephone Encounter (Signed)
Have sent Esmeralda a staff message asking if she has a status on this. Waiting on response.

## 2019-06-01 NOTE — Telephone Encounter (Signed)
Called and spoke with Katharine Look. I advised her MRI has been approved.

## 2019-06-01 NOTE — Telephone Encounter (Signed)
Called Robert Hester and advised MRI has been approved

## 2019-06-01 NOTE — Telephone Encounter (Signed)
-----   Message from Sheffield Slider sent at 06/01/2019 11:44 AM EDT -----   Mcarthur Rossetti Confirmation Number for Exam Scheduling Attention: Procedure Coordinator for [place] at [facility]   Confirmation Date: Jun 04 2019 - Jul 04 2019 Member ID Number: Patient Name: Patient Phone Number: Patient date of Birth: [id number] Robert Hester [id number] 04-28-1946 Ordering Physician: Physician Phone: ADAM JAFFE [phone] Facility: Facility Phone: [facility] [place] [phone] [employer] Number: [id number] Appointment Date: 06/04/2019 Procedure: 29476 MRI BRAIN STEM WO DYE Diagnosis: G31.84 Mild cognitive impairment, so stated   This procedure has been requested by Ordering Physician: ADAM JAFFE for the above patient.  Please note that this form does not represent a guarantee of payment.  If you have any questions regarding this confirmation notice, please call [phone] or fax [place] at [phone]. REMINDER: Please ensure you are entering the correct fax number or that the correct fax number is programmed in your system prior to sending a fax to avoid HIPAA privacy incidents. ----- Message ----- From: Clois Comber, CMA Sent: 06/01/2019  10:32 AM EDT To: Jamelle Haring, it looks like the hospital scheduled the MRI based off the MRI from the referral sent to [place]. That is strange! I did not even notice that! I saw that there was an appt for [facility] for an MRI on 6/18 and just did not notice there was not a referral for [facility]. I'll put that in. ----- Message ----- From: Ronalee Red I Sent: 06/01/2019  10:13 AM EDT To: Clois Comber, CMA  Good morning Sandi  There is no referral for this patient for anything  ----- Message ----- From: Clois Comber, CMA Sent: 06/01/2019   7:43 AM EDT To: Delane Ginger [place]  Good Morning Es!  [name] called the office concerning his MRI at [facility] on 6/18. He states the MRI  department has asked him to verify his insurance has approved his MRI. Do you know the status of this PA?  As always, thank you for you help! :)

## 2019-06-04 ENCOUNTER — Ambulatory Visit (HOSPITAL_COMMUNITY)
Admission: RE | Admit: 2019-06-04 | Discharge: 2019-06-04 | Disposition: A | Discharge status: Home / Self Care | Payer: Medicare HMO | Source: Ambulatory Visit | Attending: Neurology | Admitting: Neurology

## 2019-06-04 ENCOUNTER — Other Ambulatory Visit: Payer: Self-pay

## 2019-06-04 DIAGNOSIS — G3184 Mild cognitive impairment, so stated: Secondary | ICD-10-CM | POA: Diagnosis not present
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
## 2019-06-08 ENCOUNTER — Telehealth (INDEPENDENT_AMBULATORY_CARE_PROVIDER_SITE_OTHER): Payer: Medicare HMO | Admitting: Neurology

## 2019-06-08 ENCOUNTER — Encounter

## 2019-06-08 ENCOUNTER — Other Ambulatory Visit: Payer: Self-pay

## 2019-06-08 ENCOUNTER — Encounter: Payer: Self-pay | Admitting: Neurology

## 2019-06-08 VITALS — Ht 68.0 in | Wt 180.0 lb

## 2019-06-08 DIAGNOSIS — R5381 Other malaise: Secondary | ICD-10-CM | POA: Insufficient documentation

## 2019-06-08 DIAGNOSIS — M545 Low back pain, unspecified: Secondary | ICD-10-CM | POA: Insufficient documentation

## 2019-06-08 DIAGNOSIS — R109 Unspecified abdominal pain: Secondary | ICD-10-CM | POA: Insufficient documentation

## 2019-06-08 DIAGNOSIS — R5383 Other fatigue: Secondary | ICD-10-CM | POA: Insufficient documentation

## 2019-06-08 DIAGNOSIS — Z800 Family history of malignant neoplasm of digestive organs: Secondary | ICD-10-CM | POA: Insufficient documentation

## 2019-06-08 DIAGNOSIS — L570 Actinic keratosis: Secondary | ICD-10-CM | POA: Insufficient documentation

## 2019-06-08 DIAGNOSIS — D696 Thrombocytopenia, unspecified: Secondary | ICD-10-CM | POA: Insufficient documentation

## 2019-06-08 DIAGNOSIS — K562 Volvulus: Secondary | ICD-10-CM | POA: Insufficient documentation

## 2019-06-08 DIAGNOSIS — H269 Unspecified cataract: Secondary | ICD-10-CM | POA: Insufficient documentation

## 2019-06-08 DIAGNOSIS — Q438 Other specified congenital malformations of intestine: Secondary | ICD-10-CM | POA: Insufficient documentation

## 2019-06-08 DIAGNOSIS — G301 Alzheimer's disease with late onset: Secondary | ICD-10-CM

## 2019-06-08 DIAGNOSIS — F0280 Dementia in other diseases classified elsewhere without behavioral disturbance: Secondary | ICD-10-CM

## 2019-06-08 DIAGNOSIS — K4090 Unilateral inguinal hernia, without obstruction or gangrene, not specified as recurrent: Secondary | ICD-10-CM | POA: Insufficient documentation

## 2019-06-08 MED ORDER — MEMANTINE HCL 10 MG PO TABS: ORAL_TABLET | ORAL | 0 refills | Status: DC

## 2019-06-08 NOTE — Patient Instructions (Signed)
  RESOURCES: Development worker, community of [facility]: [phone]  Tel [place]:  [phone]  [email or web address]   Resources for common questions found under "Pathways & Protocols "  www.[facility]ior-resources[facility].org/pathways/Pathways_Menu.htm   Resources for [place] [facility] and [facility]isted [facility] facilities:  www.[email or web address]   For assistance with senior care, elder law, and estate planning (POA, medical directives):  [employer]  [address], [place] [address]  Tel: [phone]  [email or web address]   Berneice Heinrich  Tel: [phone]  [email or web address]    Start Namenda (memantine) 10mg  tablets.  Take 1/2 tablet at bedtime for 7 days, then 1/2 tablet twice daily for 7 days, then 1/2 tablet in morning and 1 tablet at bedtime for 7 days, then 1 tablet twice daily.   Side effects include dizziness, headache, diarrhea or constipation.  Call with any questions or concerns.

## 2019-07-17 ENCOUNTER — Other Ambulatory Visit: Payer: Self-pay | Admitting: Neurology

## 2019-07-20 DIAGNOSIS — D229 Melanocytic nevi, unspecified: Secondary | ICD-10-CM | POA: Diagnosis not present

## 2019-07-20 DIAGNOSIS — L821 Other seborrheic keratosis: Secondary | ICD-10-CM | POA: Diagnosis not present

## 2019-07-20 DIAGNOSIS — L819 Disorder of pigmentation, unspecified: Secondary | ICD-10-CM | POA: Diagnosis not present

## 2019-07-20 DIAGNOSIS — Z85820 Personal history of malignant melanoma of skin: Secondary | ICD-10-CM | POA: Diagnosis not present

## 2019-07-20 DIAGNOSIS — L814 Other melanin hyperpigmentation: Secondary | ICD-10-CM | POA: Diagnosis not present

## 2019-07-20 DIAGNOSIS — D1801 Hemangioma of skin and subcutaneous tissue: Secondary | ICD-10-CM | POA: Diagnosis not present

## 2019-08-21 ENCOUNTER — Other Ambulatory Visit: Payer: Self-pay | Admitting: Neurology

## 2019-09-29 DIAGNOSIS — Z23 Encounter for immunization: Secondary | ICD-10-CM | POA: Diagnosis not present

## 2019-11-03 ENCOUNTER — Other Ambulatory Visit: Payer: Self-pay | Admitting: Neurology

## 2019-11-03 DIAGNOSIS — F0280 Dementia in other diseases classified elsewhere without behavioral disturbance: Secondary | ICD-10-CM

## 2019-11-03 DIAGNOSIS — G301 Alzheimer's disease with late onset: Secondary | ICD-10-CM

## 2019-12-15 DIAGNOSIS — H5203 Hypermetropia, bilateral: Secondary | ICD-10-CM | POA: Diagnosis not present
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
## 2020-03-08 ENCOUNTER — Ambulatory Visit: Payer: Medicare HMO | Admitting: Neurology

## 2020-05-14 ENCOUNTER — Other Ambulatory Visit: Payer: Self-pay | Admitting: Neurology

## 2020-05-14 DIAGNOSIS — F0280 Dementia in other diseases classified elsewhere without behavioral disturbance: Secondary | ICD-10-CM
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
## 2020-06-24 ENCOUNTER — Ambulatory Visit (INDEPENDENT_AMBULATORY_CARE_PROVIDER_SITE_OTHER): Payer: Medicare Other | Admitting: Neurology

## 2020-06-24 ENCOUNTER — Encounter: Payer: Self-pay | Admitting: Neurology

## 2020-06-24 ENCOUNTER — Other Ambulatory Visit: Payer: Self-pay

## 2020-06-24 ENCOUNTER — Telehealth: Payer: Self-pay | Admitting: Neurology

## 2020-06-24 VITALS — BP 130/80 | HR 76 | Ht 65.0 in | Wt 180.0 lb

## 2020-06-24 DIAGNOSIS — G301 Alzheimer's disease with late onset: Secondary | ICD-10-CM | POA: Diagnosis not present

## 2020-06-24 DIAGNOSIS — F0280 Dementia in other diseases classified elsewhere without behavioral disturbance: Secondary | ICD-10-CM | POA: Diagnosis not present

## 2020-06-24 NOTE — Patient Instructions (Addendum)
Continue donepezil and memantine  RESOURCES: Development worker, community of [facility]: [phone]  Tel [place]:  [phone]  [email or web address]   Resources for common questions found under "Pathways & Protocols "  www.senior-resources[facility].org/pathways/Pathways_Menu.htm   Resources for [place] [facility] and Assisted Living facilities:  www.ncnursinghomeguide.com   For assistance with senior care, elder law, and estate planning (POA, medical directives):  [employer]  [address], [place] [address]  Tel: [phone]  [email or web address]   Berneice Heinrich  Tel: [phone]  [email or web address]

## 2020-06-24 NOTE — Telephone Encounter (Signed)
Checked the fax and my box nothing received yet. Will check again later today

## 2020-06-24 NOTE — Telephone Encounter (Signed)
Patient's wife called and said lab were coming from [place] on [address]. She said, "His kidney function is up and we need Dr. Tomi Likens to be aware of that."

## 2020-07-08 ENCOUNTER — Ambulatory Visit: Payer: Medicare Other | Admitting: Neurology

## 2020-09-14 ENCOUNTER — Other Ambulatory Visit: Payer: Self-pay | Admitting: Neurology

## 2020-12-30 ENCOUNTER — Other Ambulatory Visit: Payer: Self-pay | Admitting: Neurology

## 2020-12-30 DIAGNOSIS — F0280 Dementia in other diseases classified elsewhere without behavioral disturbance: Secondary | ICD-10-CM

## 2021-02-09 DIAGNOSIS — H2512 Age-related nuclear cataract, left eye: Secondary | ICD-10-CM | POA: Diagnosis not present

## 2021-02-09 DIAGNOSIS — H2513 Age-related nuclear cataract, bilateral: Secondary | ICD-10-CM | POA: Diagnosis not present

## 2021-02-09 DIAGNOSIS — H25043 Posterior subcapsular polar age-related cataract, bilateral: Secondary | ICD-10-CM | POA: Diagnosis not present

## 2021-02-09 DIAGNOSIS — H25013 Cortical age-related cataract, bilateral: Secondary | ICD-10-CM | POA: Diagnosis not present

## 2021-02-09 DIAGNOSIS — H18413 Arcus senilis, bilateral: Secondary | ICD-10-CM | POA: Diagnosis not present

## 2021-02-25 DIAGNOSIS — S2242XA Multiple fractures of ribs, left side, initial encounter for closed fracture: Secondary | ICD-10-CM | POA: Diagnosis not present

## 2021-02-25 DIAGNOSIS — Y9302 Activity, running: Secondary | ICD-10-CM | POA: Diagnosis not present

## 2021-02-25 DIAGNOSIS — W010XXA Fall on same level from slipping, tripping and stumbling without subsequent striking against object, initial encounter: Secondary | ICD-10-CM | POA: Diagnosis not present

## 2021-03-08 DIAGNOSIS — H2512 Age-related nuclear cataract, left eye: Secondary | ICD-10-CM | POA: Diagnosis not present

## 2021-03-08 DIAGNOSIS — H25812 Combined forms of age-related cataract, left eye: Secondary | ICD-10-CM | POA: Diagnosis not present

## 2021-03-09 DIAGNOSIS — H2511 Age-related nuclear cataract, right eye: Secondary | ICD-10-CM | POA: Diagnosis not present

## 2021-03-29 DIAGNOSIS — H2511 Age-related nuclear cataract, right eye: Secondary | ICD-10-CM | POA: Diagnosis not present

## 2021-03-29 DIAGNOSIS — H25811 Combined forms of age-related cataract, right eye: Secondary | ICD-10-CM | POA: Diagnosis not present
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
## 2021-06-26 ENCOUNTER — Ambulatory Visit (INDEPENDENT_AMBULATORY_CARE_PROVIDER_SITE_OTHER): Payer: Medicare Other | Admitting: Neurology

## 2021-06-26 ENCOUNTER — Other Ambulatory Visit: Payer: Self-pay

## 2021-06-26 ENCOUNTER — Encounter: Payer: Self-pay | Admitting: Neurology

## 2021-06-26 VITALS — BP 113/74 | HR 71 | Wt 199.5 lb

## 2021-06-26 DIAGNOSIS — G301 Alzheimer's disease with late onset: Secondary | ICD-10-CM

## 2021-06-26 DIAGNOSIS — F0280 Dementia in other diseases classified elsewhere without behavioral disturbance: Secondary | ICD-10-CM | POA: Diagnosis not present

## 2021-06-26 NOTE — Patient Instructions (Addendum)
Increase memantine 10mg  to twice daily  Continue donepezil 10mg  at bedtime  Follow up 9 months  RESOURCES: [facility] of [facility]:  [phone]  Tel [place]:   [phone]  [email or web address]-resources-[place].org/resources.cfm    Resources for common questions found under "Pathways & Protocols "  www.senior-resources-[place].org/pathways/Pathways_Menu.htm   Resources for [facility]:  www.ncnursinghomeguide.com   For assistance with senior care, elder law, and estate planning (POA, medical directives):  [employer]  [address], [place]  [address]  Tel: [phone]  [email or web address]   Berneice Heinrich  Tel: [phone]  [email or web address]

## 2021-07-03 ENCOUNTER — Other Ambulatory Visit: Payer: Self-pay | Admitting: Gastroenterology

## 2021-07-03 DIAGNOSIS — Z800 Family history of malignant neoplasm of digestive organs: Secondary | ICD-10-CM

## 2021-07-10 DIAGNOSIS — Z0000 Encounter for general adult medical examination without abnormal findings: Secondary | ICD-10-CM | POA: Diagnosis not present

## 2021-07-10 DIAGNOSIS — N1831 Chronic kidney disease, stage 3a: Secondary | ICD-10-CM | POA: Diagnosis not present

## 2021-07-10 DIAGNOSIS — D696 Thrombocytopenia, unspecified: Secondary | ICD-10-CM | POA: Diagnosis not present

## 2021-07-10 DIAGNOSIS — R142 Eructation: Secondary | ICD-10-CM | POA: Diagnosis not present

## 2021-07-10 DIAGNOSIS — Z136 Encounter for screening for cardiovascular disorders: Secondary | ICD-10-CM | POA: Diagnosis not present

## 2021-07-10 DIAGNOSIS — Z13220 Encounter for screening for lipoid disorders: Secondary | ICD-10-CM | POA: Diagnosis not present

## 2021-07-10 DIAGNOSIS — Z1389 Encounter for screening for other disorder: Secondary | ICD-10-CM | POA: Diagnosis not present

## 2021-07-10 DIAGNOSIS — Z1211 Encounter for screening for malignant neoplasm of colon: Secondary | ICD-10-CM | POA: Diagnosis not present

## 2021-07-10 DIAGNOSIS — G309 Alzheimer's disease, unspecified: Secondary | ICD-10-CM | POA: Diagnosis not present

## 2021-08-04 ENCOUNTER — Inpatient Hospital Stay: Admission: RE | Admit: 2021-08-04 | Payer: Medicare Other | Source: Ambulatory Visit

## 2021-08-13 ENCOUNTER — Other Ambulatory Visit: Payer: Self-pay | Admitting: Neurology

## 2021-08-13 DIAGNOSIS — F0280 Dementia in other diseases classified elsewhere without behavioral disturbance: Secondary | ICD-10-CM

## 2021-08-28 ENCOUNTER — Ambulatory Visit: Payer: Medicare Other

## 2021-09-19 ENCOUNTER — Other Ambulatory Visit: Payer: Self-pay | Admitting: Neurology

## 2021-09-21 DIAGNOSIS — T148XXA Other injury of unspecified body region, initial encounter: Secondary | ICD-10-CM | POA: Diagnosis not present

## 2021-09-21 DIAGNOSIS — G309 Alzheimer's disease, unspecified: Secondary | ICD-10-CM | POA: Diagnosis not present

## 2021-09-21 DIAGNOSIS — R0789 Other chest pain: Secondary | ICD-10-CM | POA: Diagnosis not present

## 2021-09-21 DIAGNOSIS — Z23 Encounter for immunization: Secondary | ICD-10-CM | POA: Diagnosis not present

## 2021-09-25 ENCOUNTER — Ambulatory Visit: Payer: Medicare Other

## 2021-10-19 DIAGNOSIS — D3131 Benign neoplasm of right choroid: Secondary | ICD-10-CM | POA: Diagnosis not present

## 2021-10-19 DIAGNOSIS — H26493 Other secondary cataract, bilateral: Secondary | ICD-10-CM | POA: Diagnosis not present

## 2021-11-21 DIAGNOSIS — M21962 Unspecified acquired deformity of left lower leg: Secondary | ICD-10-CM | POA: Diagnosis not present

## 2021-11-21 DIAGNOSIS — M2041 Other hammer toe(s) (acquired), right foot: Secondary | ICD-10-CM | POA: Diagnosis not present

## 2021-11-21 DIAGNOSIS — M792 Neuralgia and neuritis, unspecified: Secondary | ICD-10-CM | POA: Diagnosis not present

## 2021-11-21 DIAGNOSIS — B351 Tinea unguium: Secondary | ICD-10-CM | POA: Diagnosis not present

## 2021-11-21 DIAGNOSIS — M21961 Unspecified acquired deformity of right lower leg: Secondary | ICD-10-CM | POA: Diagnosis not present

## 2021-12-13 ENCOUNTER — Other Ambulatory Visit: Payer: Self-pay | Admitting: Neurology

## 2022-03-04 ENCOUNTER — Other Ambulatory Visit: Payer: Self-pay | Admitting: Neurology

## 2022-03-04 DIAGNOSIS — F0280 Dementia in other diseases classified elsewhere without behavioral disturbance: Secondary | ICD-10-CM

## 2022-03-07 ENCOUNTER — Other Ambulatory Visit: Payer: Self-pay | Admitting: Neurology

## 2022-03-07 DIAGNOSIS — F0280 Dementia in other diseases classified elsewhere without behavioral disturbance: Secondary | ICD-10-CM

## 2022-03-09 ENCOUNTER — Other Ambulatory Visit: Payer: Self-pay | Admitting: Neurology
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
## 2022-03-28 ENCOUNTER — Ambulatory Visit: Payer: Medicare PPO | Admitting: Neurology

## 2022-03-28 ENCOUNTER — Encounter: Payer: Self-pay | Admitting: Neurology

## 2022-03-28 VITALS — BP 114/75 | HR 62 | Ht 68.0 in | Wt 205.6 lb

## 2022-03-28 DIAGNOSIS — G309 Alzheimer's disease, unspecified: Secondary | ICD-10-CM

## 2022-03-28 DIAGNOSIS — F0280 Dementia in other diseases classified elsewhere without behavioral disturbance: Secondary | ICD-10-CM

## 2022-03-28 NOTE — Patient Instructions (Signed)
Donepezil '10mg'$  at bedtime ?Memantine '10mg'$  twice daily ?

## 2022-04-08 ENCOUNTER — Other Ambulatory Visit: Payer: Self-pay | Admitting: Neurology

## 2022-04-08 DIAGNOSIS — F0280 Dementia in other diseases classified elsewhere without behavioral disturbance: Secondary | ICD-10-CM

## 2022-04-11 ENCOUNTER — Other Ambulatory Visit: Payer: Self-pay | Admitting: Neurology

## 2022-04-11 DIAGNOSIS — F0280 Dementia in other diseases classified elsewhere without behavioral disturbance: Secondary | ICD-10-CM

## 2022-05-12 ENCOUNTER — Other Ambulatory Visit: Payer: Self-pay | Admitting: Neurology

## 2022-05-12 DIAGNOSIS — F0280 Dementia in other diseases classified elsewhere without behavioral disturbance: Secondary | ICD-10-CM

## 2022-05-31 DIAGNOSIS — M792 Neuralgia and neuritis, unspecified: Secondary | ICD-10-CM | POA: Diagnosis not present

## 2022-05-31 DIAGNOSIS — I739 Peripheral vascular disease, unspecified: Secondary | ICD-10-CM | POA: Diagnosis not present

## 2022-05-31 DIAGNOSIS — M21961 Unspecified acquired deformity of right lower leg: Secondary | ICD-10-CM | POA: Diagnosis not present

## 2022-05-31 DIAGNOSIS — B351 Tinea unguium: Secondary | ICD-10-CM | POA: Diagnosis not present

## 2022-05-31 DIAGNOSIS — E1151 Type 2 diabetes mellitus with diabetic peripheral angiopathy without gangrene: Secondary | ICD-10-CM | POA: Diagnosis not present

## 2022-05-31 DIAGNOSIS — M21962 Unspecified acquired deformity of left lower leg: Secondary | ICD-10-CM | POA: Diagnosis not present

## 2022-06-07 ENCOUNTER — Other Ambulatory Visit: Payer: Self-pay | Admitting: Neurology

## 2022-07-13 ENCOUNTER — Telehealth: Payer: Self-pay | Admitting: Neurology

## 2022-07-13 NOTE — Telephone Encounter (Signed)
Pt's son called in and left a message. He states the patient has been sneaking out of the house during the heat of the day multiple times now. They keep telling him he cannot do this anymore, but he wants to hear it from Dr. Tomi Likens. They would like to have Dr. Tomi Likens call and tell him this.

## 2022-07-16 NOTE — Telephone Encounter (Signed)
I called the house number and there was a busy signal.  I called patient's cell phone and mailbox was full.  I called and left a message on his wife's cell.

## 2022-07-18 NOTE — Telephone Encounter (Signed)
Tried calling patient son to advised we have tried calling his dad no answer.

## 2022-07-23 DIAGNOSIS — D229 Melanocytic nevi, unspecified: Secondary | ICD-10-CM | POA: Diagnosis not present

## 2022-07-23 DIAGNOSIS — Z1331 Encounter for screening for depression: Secondary | ICD-10-CM | POA: Diagnosis not present

## 2022-07-23 DIAGNOSIS — R5381 Other malaise: Secondary | ICD-10-CM | POA: Diagnosis not present

## 2022-07-23 DIAGNOSIS — G309 Alzheimer's disease, unspecified: Secondary | ICD-10-CM | POA: Diagnosis not present

## 2022-07-23 DIAGNOSIS — Z136 Encounter for screening for cardiovascular disorders: Secondary | ICD-10-CM | POA: Diagnosis not present

## 2022-07-23 DIAGNOSIS — Z1211 Encounter for screening for malignant neoplasm of colon: Secondary | ICD-10-CM | POA: Diagnosis not present

## 2022-07-23 DIAGNOSIS — N1831 Chronic kidney disease, stage 3a: Secondary | ICD-10-CM | POA: Diagnosis not present

## 2022-07-23 DIAGNOSIS — R32 Unspecified urinary incontinence: Secondary | ICD-10-CM | POA: Diagnosis not present

## 2022-07-23 DIAGNOSIS — D696 Thrombocytopenia, unspecified: Secondary | ICD-10-CM | POA: Diagnosis not present

## 2022-07-23 DIAGNOSIS — Z0000 Encounter for general adult medical examination without abnormal findings: Secondary | ICD-10-CM | POA: Diagnosis not present

## 2022-08-07 ENCOUNTER — Telehealth: Payer: Self-pay | Admitting: Neurology

## 2022-08-07 NOTE — Telephone Encounter (Signed)
Tried to call line busy 

## 2022-08-07 NOTE — Telephone Encounter (Signed)
Patient's son Dian Situ called and said the patient has been exhibited risky behavior as far as insisting on going for walks by himself.   He has also been insisting on driving but hasn't been able to with their restrictions.  He is mainly concerned about him walking and nobody knows where he is. They've had to call the sheriff twice to find him. He has fallen twice on the walks by himself.  He said his dad is just not listening and they're worried for this safety.

## 2022-08-08 NOTE — Progress Notes (Signed)
    Robert Hester Robert Hester Phone: [phone]   Assessment and Plan:    1. Alzheimer's disease ([place]) 2. Physical deconditioning 3. Risk for falls -Chronic with exacerbation, initial sports medicine visit - Patient presents with his wife due to concerns over general deconditioning, difficulty with chair transfers, and overall weakening leading to risk for falls including 2 recent falls with patient on walks - No red flag symptoms or specific musculoskeletal complaints at today's visit, so no imaging ordered - I believe patient would benefit from physical therapy and potentially at future visit from occupational therapy to slow the deconditioning process that is associated with Alzheimer's as well as to decrease fall risk - Referral to physical therapy for general deconditioning with Alzheimer's - DME ordered for electric lift chair to help patient with transfers when his wife is not able to be present and decrease fall risk. - For home use only DME Other see comment - Ambulatory referral to Physical Therapy  -I recommend that the patient and his wife reach out to neurology to establish a follow-up visit as it appears neurology clinic was trying to reach them to set up an appointment  Pertinent previous records reviewed include telephone encounter from neurology 08/07/2022   Follow Up: As needed if no improvement or worsening of symptoms   Subjective:   I, Robert Hester, am serving as a Education administrator for Doctor Robert Hester  Chief Complaint: mobility issues  HPI:   08/10/2022 Patient is a 76 year old male complaining of mobility issues. Patient states they need a lift chair and PT referral   Relevant Historical Information: Alzheimer's  Additional pertinent review of systems negative.   Current Outpatient Medications:    donepezil (ARICEPT) 10 MG tablet, TAKE 1 TABLET(10 MG) BY MOUTH AT BEDTIME, Disp: 90  tablet, Rfl: 0   memantine (NAMENDA) 10 MG tablet, TAKE 1 TABLET BY MOUTH TWICE DAILY, Disp: 60 tablet, Rfl: 5   Omega-3 Fatty Acids (FISH OIL) 1000 MG CAPS, Take by mouth., Disp: , Rfl:    Objective:     Vitals:   08/10/22 1106  Pulse: 64  SpO2: 97%  Weight: 206 lb (93.4 kg)  Height: '5\' 8"'$  (1.727 m)      Body mass index is 31.32 kg/m.    Physical Exam:    General: Well-appearing, cooperative, sitting comfortably in no acute distress.  HEENT: Normocephalic, atraumatic.   Neck: No gross abnormality.  Cardiovascular: No pallor or cyanosis. Resp: Comfortable WOB.   Abdomen: Non distended.   Skin: Warm and dry; no focal rashes identified on limited exam. Extremities: No cyanosis or edema.  Upper and lower extremity muscle strength 4+/5.  Patient needing more assistance from his wife to sit down and stand up in chair in office. Neuro: Gross motor and sensory intact. Gait normal. Psychiatric: Mood and affect are appropriate.   Neuro: CN II-XII intact; strength and sensation intact generally, reflexes 2+ in upper and lower extremities     Electronically signed by:  Robert Hester D.Robert Hester Sports [facility] 11:33 AM 08/10/22

## 2022-08-10 ENCOUNTER — Telehealth: Payer: Self-pay | Admitting: Sports Medicine

## 2022-08-10 ENCOUNTER — Ambulatory Visit: Payer: Medicare PPO | Admitting: Sports Medicine

## 2022-08-10 VITALS — HR 64 | Ht 68.0 in | Wt 206.0 lb

## 2022-08-10 DIAGNOSIS — R5381 Other malaise: Secondary | ICD-10-CM

## 2022-08-10 DIAGNOSIS — F0280 Dementia in other diseases classified elsewhere without behavioral disturbance: Secondary | ICD-10-CM | POA: Diagnosis not present

## 2022-08-10 DIAGNOSIS — Z9181 History of falling: Secondary | ICD-10-CM | POA: Diagnosis not present

## 2022-08-10 DIAGNOSIS — G309 Alzheimer's disease, unspecified: Secondary | ICD-10-CM

## 2022-08-10 NOTE — Patient Instructions (Addendum)
Good to see you Pt referral  DME order power lift chair

## 2022-08-10 NOTE — Telephone Encounter (Signed)
Pt wife went to [place] for lift chair. Needs rx corrected with the following: "Power lift reclining chair" Add ICD10 and diagnosis code to rx  Fax to [place] at [phone]  Please confirm with wife when done.

## 2022-08-13 ENCOUNTER — Other Ambulatory Visit: Payer: Self-pay | Admitting: Sports Medicine

## 2022-08-13 NOTE — Telephone Encounter (Signed)
Pt was a called and spoke to his wife. She will come by and pick up Rx when convenient  for  her

## 2022-08-13 NOTE — Addendum Note (Signed)
Addended by: Pincus Badder R on: 08/13/2022 12:22 PM   Modules accepted: Orders

## 2022-08-22 ENCOUNTER — Telehealth: Payer: Self-pay | Admitting: Neurology

## 2022-08-22 NOTE — Telephone Encounter (Signed)
Patients son called in today. He updated the number to call. Said the number we were calling was no longer good. His mother just called him, she was frantic cause his dad took off on foot and she just found him. The son wants to know if he can be seen soon. I saw where we have been conversing about it, but he wanted to know how soon he can come in. He said he needs his dad to hear dr.jaffe say he cant wonder off and cant drive. He wants dr.jaffe to take his DL away. He said he is desperate and needs help with his dad.
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
## 2022-08-27 ENCOUNTER — Ambulatory Visit: Payer: Medicare PPO | Admitting: Neurology

## 2022-08-27 ENCOUNTER — Ambulatory Visit: Payer: Medicare PPO | Admitting: Physical Therapy

## 2022-08-27 ENCOUNTER — Encounter: Payer: Self-pay | Admitting: Neurology

## 2022-08-27 VITALS — BP 101/67 | HR 74 | Ht 68.0 in | Wt 206.0 lb

## 2022-08-27 DIAGNOSIS — F0280 Dementia in other diseases classified elsewhere without behavioral disturbance: Secondary | ICD-10-CM

## 2022-08-27 DIAGNOSIS — G309 Alzheimer's disease, unspecified: Secondary | ICD-10-CM | POA: Diagnosis not present

## 2022-08-27 NOTE — Patient Instructions (Signed)
No driving Do not use the stove Do not go out for walks without somebody with you Continue donepezil and memantine Follow up in 7 months

## 2022-09-03 ENCOUNTER — Other Ambulatory Visit: Payer: Self-pay

## 2022-09-03 ENCOUNTER — Encounter: Payer: Self-pay | Admitting: Physical Therapy

## 2022-09-03 ENCOUNTER — Ambulatory Visit: Payer: Medicare PPO | Attending: Sports Medicine | Admitting: Physical Therapy

## 2022-09-03 DIAGNOSIS — Z9181 History of falling: Secondary | ICD-10-CM | POA: Diagnosis not present

## 2022-09-03 DIAGNOSIS — G309 Alzheimer's disease, unspecified: Secondary | ICD-10-CM | POA: Diagnosis not present

## 2022-09-03 DIAGNOSIS — M6281 Muscle weakness (generalized): Secondary | ICD-10-CM

## 2022-09-03 DIAGNOSIS — R5381 Other malaise: Secondary | ICD-10-CM | POA: Diagnosis not present

## 2022-09-03 DIAGNOSIS — F0280 Dementia in other diseases classified elsewhere without behavioral disturbance: Secondary | ICD-10-CM | POA: Insufficient documentation

## 2022-09-03 DIAGNOSIS — R2689 Other abnormalities of gait and mobility: Secondary | ICD-10-CM | POA: Diagnosis not present

## 2022-09-03 DIAGNOSIS — R2681 Unsteadiness on feet: Secondary | ICD-10-CM | POA: Diagnosis not present

## 2022-09-03 NOTE — Therapy (Signed)
OUTPATIENT PHYSICAL THERAPY NEURO EVALUATION   Patient Name: Robert Hester MRN: [id number] DOB:01/06/1946, 76 y.o., male Today's Date: 09/04/2022   PCP: Antony Contras, MD REFERRING PROVIDER: Glennon Mac, DO   PT End of Session - 09/03/22 1411     Visit Number 1    Number of Visits 9    Date for PT Re-Evaluation 10/05/22    Authorization Type [employer]    PT Start Time 4097    PT Stop Time 1448    PT Time Calculation (min) 40 min    Activity Tolerance Patient tolerated treatment well    Behavior During Therapy [facility] for tasks assessed/performed             History reviewed. No pertinent past medical history. 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/2019   Memory loss 03/05/2016    ONSET DATE: 08/10/2022  REFERRING DIAG:  G30.9,F02.80 (ICD-10-CM) - Alzheimer's disease ([place])  R53.81 (ICD-10-CM) - Physical deconditioning  Z91.81 (ICD-10-CM) - Risk for falls    THERAPY DIAG:  Muscle weakness (generalized)  Unsteadiness on feet  Other abnormalities of gait and mobility  Rationale for Evaluation and Treatment Rehabilitation  SUBJECTIVE:                                                                                                                                                                                              SUBJECTIVE STATEMENT: Describes difficulty of getting in and our of car; struggles to get up and down from their low sofa.  Has always been very active.  Has had several falls while walking.  Is not supposed to be walking  by himself. Pt accompanied by: significant other  PERTINENT HISTORY: See above; Alzheimer's diagnosis  PAIN:  Are you having pain? No  PRECAUTIONS: Fall and Other: No driving  WEIGHT BEARING RESTRICTIONS No  FALLS: Has patient fallen in last 6 months? Yes. Number of falls 2-3  LIVING ENVIRONMENT: Lives with: lives with their spouse Lives in: House/apartment Stairs: Yes: External: 2-3 steps; on right going up Has following equipment at home: Single point cane, Walker - 2 wheeled, and does not typically use any device  PLOF: Independent and enjoys walking  in the neighborhood  Has started going to [employer] for use of machines.  PATIENT GOALS Goals would be to improve mobility.  OBJECTIVE:   DIAGNOSTIC FINDINGS: NA  COGNITION: Overall cognitive status: History of cognitive impairments - at baseline   POSTURE: rounded shoulders  LOWER EXTREMITY ROM:   WFL throughout BLEs in sitting  Active  Right Eval Left Eval  Hip flexion    Hip extension    Hip abduction    Hip adduction    Hip internal rotation    Hip external rotation    Knee flexion    Knee extension    Ankle dorsiflexion    Ankle plantarflexion    Ankle inversion    Ankle eversion     (Blank rows = not tested)  LOWER EXTREMITY MMT:    MMT Right Eval Left Eval  Hip flexion 4+ 4+  Hip extension    Hip abduction    Hip adduction    Hip internal rotation    Hip external rotation    Knee flexion 4+ 4+  Knee extension 4+ 4+  Ankle dorsiflexion 4 4+  Ankle plantarflexion    Ankle inversion    Ankle eversion    (Blank rows = not tested)   TRANSFERS: Assistive device utilized:  Uses hands as needed for sit<>stand   Sit to stand: Modified independence Stand to sit: Modified independence  GAIT: Gait pattern: step through pattern, decreased arm swing- Right, decreased arm swing- Left, trendelenburg, and wide BOS Distance walked: 250 ft Assistive device utilized: None Level of assistance:  Modified independence Comments: Increased lateral trunk lean/Trendelenburg pattern with increased gait distance  FUNCTIONAL TESTs:  5 times sit to stand: 20.04 sec Timed up and go (TUG): 22.65 2 minute walk test: 250 ft no device Dynamic Gait Index: NT Gait velocity:  19.85 sec = 1.65 ft/sec *2MWT norms for 34-79 age group:  32 ft  TODAY'S TREATMENT:  Initiated HEP-see below   PATIENT EDUCATION: Education details: PT eval results, POC, HEP initiated Person educated: Patient and Spouse Education method: Explanation, Demonstration, and Handouts Education comprehension: verbalized understanding and returned demonstration   HOME EXERCISE PROGRAM: Access Code: [email or web address] URL: [email or web address] Date: 09/03/2022 Prepared by: [facility]  Exercises - Sit to Stand  - 1-2 x daily - 5 x weekly - 2 sets - 5-10 reps - Single Leg Stance with Support  - 1 x daily - 5 x weekly - 1 sets - 3 reps - 10 sec hold    GOALS: Goals reviewed with patient? Yes  SHORT TERM GOALS: Target date= LTGs  LONG TERM GOALS: Target date: 10/05/2022  Pt will be supervision with HEP for improved strength, balance, gait. Baseline:  Goal status: INITIAL  2.  Pt will improve 5x sit<>stand to less than or equal to 13 sec to demonstrate improved functional strength and transfer efficiency. Baseline: 20.04 sec Goal status: INITIAL  3.  Pt will improve TUG score to less than or equal to 15 sec for decreased fall risk. Baseline: 22.65 sec Goal status: INITIAL  4.  Pt will improve gait velocity to at least 2 ft/sec for improved gait efficiency and safety. Baseline: 1.65 ft/sec Goal status: INITIAL  5.  Pt will ambulate at least 400 ft in 2 MWT for improved gait effiency and safety with his community walks. Baseline: 250 ft in 2 MWT (normal values = 517 ft) Goal status: INITIAL   ASSESSMENT:  CLINICAL IMPRESSION: Patient is a  76 y.o. male who was  seen today for physical therapy evaluation and treatment for malaise, deconditioning associated with his Alzheimer's disease.  He is at increased risk of falls per 5x sit<>stand, TUG, and gait velocity scores.  He has had hx of falls.  He demonstrates decreased strength, decreased balance, abnormal posture, decreased gait velocity and endurance for gait.  He will benefit from skilled PT to address the above stated deficits for improved overall functional mobility and decreased fall risk.  OBJECTIVE IMPAIRMENTS Abnormal gait, decreased balance, decreased mobility, difficulty walking, decreased strength, and postural dysfunction.   ACTIVITY LIMITATIONS standing, transfers, and locomotion level  PARTICIPATION LIMITATIONS: shopping and community activity  PERSONAL FACTORS 1-2 comorbidities: See PMH; of note:  Azheimer's.  Pt does appear to have good family support.  are also affecting patient's functional outcome.   REHAB POTENTIAL: Good  CLINICAL DECISION MAKING: Evolving/moderate complexity  EVALUATION COMPLEXITY: Moderate  PLAN: PT FREQUENCY: 2x/week  PT DURATION: 4 weeks plus eval = 5 week POC  PLANNED INTERVENTIONS: Therapeutic exercises, Therapeutic activity, Neuromuscular re-education, Balance training, Gait training, Patient/Family education, and Self Care  PLAN FOR NEXT SESSION: Review initial HEP and continue to update HEP as appropriate for lower extremity strengthening, balance, gait   Nakyla Bracco W., PT 09/04/2022, 2:31 PM  [facility] at [facility] [address], [place], [place] [address] Phone # [phone] Fax # [phone]

## 2022-09-07 DIAGNOSIS — M21962 Unspecified acquired deformity of left lower leg: Secondary | ICD-10-CM | POA: Diagnosis not present

## 2022-09-07 DIAGNOSIS — M792 Neuralgia and neuritis, unspecified: Secondary | ICD-10-CM | POA: Diagnosis not present

## 2022-09-07 DIAGNOSIS — L97512 Non-pressure chronic ulcer of other part of right foot with fat layer exposed: Secondary | ICD-10-CM | POA: Diagnosis not present

## 2022-09-07 DIAGNOSIS — I739 Peripheral vascular disease, unspecified: Secondary | ICD-10-CM | POA: Diagnosis not present

## 2022-09-07 DIAGNOSIS — E1151 Type 2 diabetes mellitus with diabetic peripheral angiopathy without gangrene: Secondary | ICD-10-CM | POA: Diagnosis not present

## 2022-09-07 DIAGNOSIS — M21961 Unspecified acquired deformity of right lower leg: Secondary | ICD-10-CM | POA: Diagnosis not present

## 2022-09-07 DIAGNOSIS — B351 Tinea unguium: Secondary | ICD-10-CM | POA: Diagnosis not present

## 2022-09-11 ENCOUNTER — Ambulatory Visit: Payer: Medicare PPO

## 2022-09-11 DIAGNOSIS — M6281 Muscle weakness (generalized): Secondary | ICD-10-CM | POA: Diagnosis not present

## 2022-09-11 DIAGNOSIS — R2689 Other abnormalities of gait and mobility: Secondary | ICD-10-CM

## 2022-09-11 DIAGNOSIS — Z9181 History of falling: Secondary | ICD-10-CM | POA: Diagnosis not present

## 2022-09-11 DIAGNOSIS — R2681 Unsteadiness on feet: Secondary | ICD-10-CM

## 2022-09-11 DIAGNOSIS — G309 Alzheimer's disease, unspecified: Secondary | ICD-10-CM | POA: Diagnosis not present

## 2022-09-11 DIAGNOSIS — F0280 Dementia in other diseases classified elsewhere without behavioral disturbance: Secondary | ICD-10-CM | POA: Diagnosis not present

## 2022-09-11 DIAGNOSIS — R5381 Other malaise: Secondary | ICD-10-CM | POA: Diagnosis not present

## 2022-09-11 NOTE — Therapy (Signed)
OUTPATIENT PHYSICAL THERAPY NEURO TREATMENT   Patient Name: Robert Hester MRN: [id number] DOB:March 19, 1946, 76 y.o., male Today's Date: 09/11/2022   PCP: Antony Contras, MD REFERRING PROVIDER: Glennon Mac, DO   PT End of Session - 09/11/22 1116     Visit Number 2    Number of Visits 9    Date for PT Re-Evaluation 10/05/22    Authorization Type [employer]    PT Start Time 1115   late arrival   PT Stop Time 1145    PT Time Calculation (min) 30 min    Activity Tolerance Patient tolerated treatment well    Behavior During Therapy [facility] for tasks assessed/performed             No past medical history on file. 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   Memory loss 03/05/2016    ONSET DATE: 08/10/2022  REFERRING DIAG:  G30.9,F02.80 (ICD-10-CM) - Alzheimer's disease ([place])  R53.81 (ICD-10-CM) - Physical deconditioning  Z91.81 (ICD-10-CM) - Risk for falls    THERAPY DIAG:  Muscle weakness (generalized)  Unsteadiness on feet  Other abnormalities of gait and mobility  Rationale for Evaluation and Treatment Rehabilitation  SUBJECTIVE:                                                                                                                                                                                              SUBJECTIVE STATEMENT: Difficulty in getting in/out of car  Pt accompanied by: significant other  PERTINENT HISTORY: See above; Alzheimer's diagnosis  PAIN:  Are you having pain? No  PRECAUTIONS: Fall and Other: No  driving  WEIGHT BEARING RESTRICTIONS No  FALLS: Has patient fallen in last 6 months? Yes. Number of falls 2-3  LIVING ENVIRONMENT: Lives with: lives with their spouse Lives in: House/apartment Stairs: Yes: External: 2-3 steps; on right going up Has following equipment at home: Single point cane, Walker - 2 wheeled, and does not typically use any device  PLOF: Independent and enjoys walking in the neighborhood  Has started going to [employer] for use of machines.  PATIENT GOALS Goals would be to improve mobility.  OBJECTIVE:   TODAY'S TREATMENT: 09/11/22 Activity Comments  NU-step resistance intervals Warm-up level 6 x 3 min then 1:1 heavy:light x 4 min  Gait training w/ rollator  Sba-CGA for brake mgmt and sequencing for use of device and seated rest periods  Car transfers w/ supervision Verbal cues for sequence. Education on door handle assist device             DIAGNOSTIC FINDINGS: NA  COGNITION: Overall cognitive status: History of cognitive impairments - at baseline   POSTURE: rounded shoulders  LOWER EXTREMITY ROM:   WFL throughout BLEs in sitting  Active  Right Eval Left Eval  Hip flexion    Hip extension    Hip abduction    Hip adduction    Hip internal rotation    Hip external rotation    Knee flexion    Knee extension    Ankle dorsiflexion    Ankle plantarflexion    Ankle inversion    Ankle eversion     (Blank rows = not tested)  LOWER EXTREMITY MMT:    MMT Right Eval Left Eval  Hip flexion 4+ 4+  Hip extension    Hip abduction    Hip adduction    Hip internal rotation    Hip external rotation    Knee flexion 4+ 4+  Knee extension 4+ 4+  Ankle dorsiflexion 4 4+  Ankle plantarflexion    Ankle inversion    Ankle eversion    (Blank rows = not tested)   TRANSFERS: Assistive device utilized:  Uses hands as needed for sit<>stand   Sit to stand: Modified independence Stand to sit: Modified independence  GAIT: Gait pattern: step through  pattern, decreased arm swing- Right, decreased arm swing- Left, trendelenburg, and wide BOS Distance walked: 250 ft Assistive device utilized: None Level of assistance: Modified independence Comments: Increased lateral trunk lean/Trendelenburg pattern with increased gait distance  FUNCTIONAL TESTs:  5 times sit to stand: 20.04 sec Timed up and go (TUG): 22.65 2 minute walk test: 250 ft no device Dynamic Gait Index: NT Gait velocity:  19.85 sec = 1.65 ft/sec *2MWT norms for 108-79 age group:  30 ft  TODAY'S TREATMENT:  Initiated HEP-see below   PATIENT EDUCATION: Education details: PT eval results, POC, HEP initiated Person educated: Patient and Spouse Education method: Explanation, Demonstration, and Handouts Education comprehension: verbalized understanding and returned demonstration   HOME EXERCISE PROGRAM: Access Code: [email or web address] URL: [email or web address] Date: 09/03/2022 Prepared by: [facility]  Exercises - Sit to Stand  - 1-2 x daily - 5 x weekly - 2 sets - 5-10 reps - Single Leg Stance with Support  - 1 x daily - 5 x weekly - 1 sets - 3 reps - 10 sec hold    GOALS: Goals reviewed with patient? Yes  SHORT TERM GOALS: Target date= LTGs  LONG TERM GOALS: Target date: 10/05/2022  Pt will be supervision with HEP for improved strength, balance, gait. Baseline:  Goal status: INITIAL  2.  Pt will improve 5x sit<>stand to less than or equal to 13 sec to demonstrate improved functional strength and transfer efficiency. Baseline: 20.04 sec Goal status: INITIAL  3.  Pt will improve TUG score to less than or equal to 15 sec for decreased fall risk. Baseline: 22.65 sec Goal status: INITIAL  4.  Pt will improve gait velocity to at least 2 ft/sec for improved gait efficiency and safety. Baseline: 1.65 ft/sec Goal status: INITIAL  5.  Pt will ambulate at least 400 ft  in 2 MWT for improved gait effiency and safety with  his community walks. Baseline: 250 ft in 2 MWT (normal values = 517 ft) Goal status: INITIAL   ASSESSMENT:  CLINICAL IMPRESSION: Difficulty with sequencing mobility requiring verbal/tactile cues and structure for coordination.  Initiated training w/ rollator device and again requiring heavy cues in sequence and safety with brake mgmt using visual, verbal, tactile cues. Continued sessions indicated to progress gait/balance to reduce risk for falls.  OBJECTIVE IMPAIRMENTS Abnormal gait, decreased balance, decreased mobility, difficulty walking, decreased strength, and postural dysfunction.   ACTIVITY LIMITATIONS standing, transfers, and locomotion level  PARTICIPATION LIMITATIONS: shopping and community activity  PERSONAL FACTORS 1-2 comorbidities: See PMH; of note:  Azheimer's.  Pt does appear to have good family support.  are also affecting patient's functional outcome.   REHAB POTENTIAL: Good  CLINICAL DECISION MAKING: Evolving/moderate complexity  EVALUATION COMPLEXITY: Moderate  PLAN: PT FREQUENCY: 2x/week  PT DURATION: 4 weeks plus eval = 5 week POC  PLANNED INTERVENTIONS: Therapeutic exercises, Therapeutic activity, Neuromuscular re-education, Balance training, Gait training, Patient/Family education, and Self Care  PLAN FOR NEXT SESSION: Review initial HEP and continue to update HEP as appropriate for lower extremity strengthening, balance, gait   Toniann Fail, PT 09/11/2022, 11:17 AM  [facility] at [facility] [address] [place], [place] [address] Phone # [phone] Fax # [phone]

## 2022-09-13 DIAGNOSIS — Z23 Encounter for immunization: Secondary | ICD-10-CM | POA: Diagnosis not present

## 2022-09-17 ENCOUNTER — Encounter: Payer: Self-pay | Admitting: Physical Therapy

## 2022-09-17 ENCOUNTER — Ambulatory Visit: Payer: Medicare PPO | Attending: Sports Medicine | Admitting: Physical Therapy

## 2022-09-17 DIAGNOSIS — R2689 Other abnormalities of gait and mobility: Secondary | ICD-10-CM | POA: Diagnosis not present

## 2022-09-17 DIAGNOSIS — M6281 Muscle weakness (generalized): Secondary | ICD-10-CM | POA: Diagnosis not present

## 2022-09-17 DIAGNOSIS — R2681 Unsteadiness on feet: Secondary | ICD-10-CM | POA: Diagnosis not present

## 2022-09-17 NOTE — Therapy (Signed)
OUTPATIENT PHYSICAL THERAPY NEURO TREATMENT   Patient Name: Robert Hester MRN: [id number] DOB:Mar 02, 1946, 76 y.o., male Today's Date: 09/17/2022   PCP: Antony Contras, MD REFERRING PROVIDER: Glennon Mac, DO   PT End of Session - 09/17/22 1105     Visit Number 3    Number of Visits 9    Date for PT Re-Evaluation 10/05/22    Authorization Type [employer]    PT Start Time 1106   pt arrives late   PT Stop Time 1146    PT Time Calculation (min) 40 min    Activity Tolerance Patient tolerated treatment well    Behavior During Therapy [facility] for tasks assessed/performed              History reviewed. No pertinent past medical history. 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   Memory loss 03/05/2016    ONSET DATE: 08/10/2022  REFERRING DIAG:  G30.9,F02.80 (ICD-10-CM) - Alzheimer's disease ([place])  R53.81 (ICD-10-CM) - Physical deconditioning  Z91.81 (ICD-10-CM) - Risk for falls    THERAPY DIAG:  Muscle weakness (generalized)  Unsteadiness on feet  Other abnormalities of gait and mobility  Rationale for Evaluation and Treatment Rehabilitation  SUBJECTIVE:                                                                                                                                                                                              SUBJECTIVE STATEMENT: Nothing new.  Wife reports they are to be getting rollator, but it may be a few weeks.  Pt accompanied by: significant other  PERTINENT HISTORY: See above; Alzheimer's  diagnosis  PAIN:  Are you having pain? No  PRECAUTIONS: Fall and Other: No driving  WEIGHT BEARING RESTRICTIONS No  FALLS: Has patient fallen in last 6 months? Yes. Number of falls 2-3  LIVING ENVIRONMENT: Lives with: lives with their spouse Lives in: House/apartment Stairs: Yes: External: 2-3 steps; on right going up Has following equipment at home: Single point cane, Walker - 2 wheeled, and does not typically use any device  PLOF: Independent and enjoys walking in the neighborhood  Has started going to [employer] for use of machines.  PATIENT GOALS Goals  would be to improve mobility.  OBJECTIVE:    TODAY'S TREATMENT: 09/17/2022 Activity Comments  Sit<>stand, 2 x 5 reps, no UE support   SLS, 3 x 10 sec, BUE support>1 UE support   Sidestepping along parallel bars, 3 reps, then 2 reps with added 2# weight Progressing from no UE support  Forward/back walking in parallel bars, 3 reps BUE support, 2# weights Cues for increased step length in posterior direction  Hip strengthening,2# Hip abduction 2 x 10 Hip extension 2 x 10 Hip/knee flexion 2 x 10 reps BUE support cues for posture and technique  Gait with rollator, min guard assist indoor/outdoor surfaces, 450 ft Pt with forward flexed posture at rollator, needs cues for staying closed to BOS of rollator, for increased step length.  Cues and assist for smooth turns         Access Code: [email or web address] URL: [email or web address] Date: 09/17/2022-last updated Prepared by: [facility]  Exercises - Sit to Stand  - 1-2 x daily - 5 x weekly - 2 sets - 5-10 reps - Single Leg Stance with Support  - 1 x daily - 5 x weekly - 1 sets - 3 reps - 10 sec hold - Standing Hip Abduction with Counter Support  - 1 x daily - 5 x weekly - 2 sets - 10 reps - Standing Hip Extension with Counter Support  - 1 x daily - 5 x weekly - 2 sets - 10 reps - Standing Marching  - 1 x daily - 5 x weekly - 2 sets -  10 reps   PATIENT EDUCATION: Education details: HEP updates Person educated: Patient and Spouse Education method: Explanation, Demonstration, and Handouts Education comprehension: verbalized understanding and returned demonstration    ------------------------------------------------------------------------------------------------------- Objective measures below from Eval: DIAGNOSTIC FINDINGS: NA  COGNITION: Overall cognitive status: History of cognitive impairments - at baseline   POSTURE: rounded shoulders  LOWER EXTREMITY ROM:   WFL throughout BLEs in sitting  Active  Right Eval Left Eval  Hip flexion    Hip extension    Hip abduction    Hip adduction    Hip internal rotation    Hip external rotation    Knee flexion    Knee extension    Ankle dorsiflexion    Ankle plantarflexion    Ankle inversion    Ankle eversion     (Blank rows = not tested)  LOWER EXTREMITY MMT:    MMT Right Eval Left Eval  Hip flexion 4+ 4+  Hip extension    Hip abduction    Hip adduction    Hip internal rotation    Hip external rotation    Knee flexion 4+ 4+  Knee extension 4+ 4+  Ankle dorsiflexion 4 4+  Ankle plantarflexion    Ankle inversion    Ankle eversion    (Blank rows = not tested)   TRANSFERS: Assistive device utilized:  Uses hands as needed for sit<>stand   Sit to stand: Modified independence Stand to sit: Modified independence  GAIT: Gait pattern: step through pattern, decreased arm swing- Right, decreased arm swing- Left, trendelenburg, and wide BOS Distance walked: 250 ft Assistive device utilized: None Level of assistance: Modified independence Comments: Increased lateral trunk lean/Trendelenburg pattern with increased gait distance  FUNCTIONAL TESTs:  5 times sit to stand: 20.04 sec Timed up and go (TUG): 22.65 2 minute walk test: 250 ft no device Dynamic Gait Index: NT Gait velocity:  19.85 sec = 1.65  ft/sec *2MWT norms for 47-79 age group:  61  ft  TODAY'S TREATMENT:  Initiated HEP-see below   PATIENT EDUCATION: Education details: PT eval results, POC, HEP initiated Person educated: Patient and Spouse Education method: Explanation, Demonstration, and Handouts Education comprehension: verbalized understanding and returned demonstration   HOME EXERCISE PROGRAM: Access Code: [email or web address] URL: [email or web address] Date: 09/03/2022 Prepared by: [facility]    GOALS: Goals reviewed with patient? Yes  SHORT TERM GOALS: Target date= LTGs  LONG TERM GOALS: Target date: 10/05/2022  Pt will be supervision with HEP for improved strength, balance, gait. Baseline:  Goal status: IN PROGRESS  2.  Pt will improve 5x sit<>stand to less than or equal to 13 sec to demonstrate improved functional strength and transfer efficiency. Baseline: 20.04 sec Goal status: IN PROGRESS  3.  Pt will improve TUG score to less than or equal to 15 sec for decreased fall risk. Baseline: 22.65 sec Goal status: IN PROGRESS  4.  Pt will improve gait velocity to at least 2 ft/sec for improved gait efficiency and safety. Baseline: 1.65 ft/sec Goal status: IN PROGRESS  5.  Pt will ambulate at least 400 ft in 2 MWT for improved gait effiency and safety with his community walks. Baseline: 250 ft in 2 MWT (normal values = 517 ft) Goal status: IN PROGRESS   ASSESSMENT:  CLINICAL IMPRESSION: Skilled PT session today focused on lower extremity strengthening exercises and gait with rollator.  Utilized 2# weights for standing exercises, with pt needing max verbal, visual, tactile cues for technique.  Also worked on Personnel officer with rollator, as wife reports they will be getting one for home use for his walks outdoors with son.  Pt needs assist to keep close to rollator BOS and to lessen forward flexed posture/shuffling steps.  He will need continued work on this and family education for rollator safety.     OBJECTIVE IMPAIRMENTS Abnormal gait, decreased balance, decreased mobility, difficulty walking, decreased strength, and postural dysfunction.   ACTIVITY LIMITATIONS standing, transfers, and locomotion level  PARTICIPATION LIMITATIONS: shopping and community activity  PERSONAL FACTORS 1-2 comorbidities: See PMH; of note:  Azheimer's.  Pt does appear to have good family support.  are also affecting patient's functional outcome.   REHAB POTENTIAL: Good  CLINICAL DECISION MAKING: Evolving/moderate complexity  EVALUATION COMPLEXITY: Moderate  PLAN: PT FREQUENCY: 2x/week  PT DURATION: 4 weeks plus eval = 5 week POC  PLANNED INTERVENTIONS: Therapeutic exercises, Therapeutic activity, Neuromuscular re-education, Balance training, Gait training, Patient/Family education, and Self Care  PLAN FOR NEXT SESSION: Review updates to HEP and continue to update HEP as appropriate for lower extremity strengthening, balance, gait.  Gait training/family education on rollator use   Frazier Butt., PT 09/17/2022, 11:54 AM  [facility] Re[facility] at [facility] [place], [place], [place] [address] Phone # [phone] Fax # [phone]

## 2022-09-24 ENCOUNTER — Ambulatory Visit: Payer: Medicare PPO | Admitting: Physical Therapy

## 2022-09-24 ENCOUNTER — Encounter: Payer: Self-pay | Admitting: Physical Therapy

## 2022-09-24 DIAGNOSIS — M6281 Muscle weakness (generalized): Secondary | ICD-10-CM | POA: Diagnosis not present

## 2022-09-24 DIAGNOSIS — R2689 Other abnormalities of gait and mobility: Secondary | ICD-10-CM | POA: Diagnosis not present

## 2022-09-24 DIAGNOSIS — R2681 Unsteadiness on feet: Secondary | ICD-10-CM

## 2022-09-24 NOTE — Therapy (Signed)
OUTPATIENT PHYSICAL THERAPY NEURO TREATMENT   Patient Name: Robert Hester MRN: [id number] DOB:03/02/1946, 76 y.o., male Today's Date: 09/24/2022   PCP: Antony Contras, MD REFERRING PROVIDER: Glennon Mac, DO   PT End of Session - 09/24/22 1228     Visit Number 4    Number of Visits 9    Date for PT Re-Evaluation 10/05/22    Authorization Type [employer]    PT Start Time 1230    PT Stop Time 1312    PT Time Calculation (min) 42 min    Activity Tolerance Patient tolerated treatment well    Behavior During Therapy [facility] for tasks assessed/performed               History reviewed. No pertinent past medical history. 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   Memory loss 03/05/2016    ONSET DATE: 08/10/2022  REFERRING DIAG:  G30.9,F02.80 (ICD-10-CM) - Alzheimer's disease ([place])  R53.81 (ICD-10-CM) - Physical deconditioning  Z91.81 (ICD-10-CM) - Risk for falls    THERAPY DIAG:  Muscle weakness (generalized)  Unsteadiness on feet  Other abnormalities of gait and mobility  Rationale for Evaluation and Treatment Rehabilitation  SUBJECTIVE:                                                                                                                                                                                              SUBJECTIVE STATEMENT: Nothing new.  No pain.  Reports no falls.  Pt accompanied by: significant other  PERTINENT HISTORY: See above; Alzheimer's diagnosis  PAIN:  Are you having pain? No  PRECAUTIONS: Fall and  Other: No driving  WEIGHT BEARING RESTRICTIONS No  FALLS: Has patient fallen in last 6 months? Yes. Number of falls 2-3  LIVING ENVIRONMENT: Lives with: lives with their spouse Lives in: House/apartment Stairs: Yes: External: 2-3 steps; on right going up Has following equipment at home: Single point cane, Walker - 2 wheeled, and does not typically use any device  PLOF: Independent and enjoys walking in the neighborhood  Has started going to [employer] for use of machines.  PATIENT GOALS Goals would be to improve mobility.  OBJECTIVE:    TODAY'S TREATMENT:  09/24/2022 Activity Comments  NuStep, Level 4>5, 6 minutes, 4 extremities for lower extremity strengthening (BLEs only x 1:30) Keeps SPM 85-95  Reviewed HEP, from last visit: Hip strengthening,2# Hip abduction 2 x 10 Hip extension 2 x 10 Hip/knee flexion 2 x 10 reps BUE support; cues for posture and technique (for hip abduction, cues to avoid external rotation)  Sit<>stand, 3 x 5 reps, no UE support; 2 sets holding 5# weight From mat  Sidestep/together, R and L; 2# weight, 2 x 10 reps Cues to lessen UE support  Heel/toe raises 2 x 10, 2#   Forward/back walking in parallel bars, 3 reps BUE support, 2# weights Cues for increased step length in posterior direction, very slowed pace  Short distance gait no device, 50 ft x 4 reps, with cues for increased step length, foot clearance Able to have improved foot clearance, step length with consistent cues     Access Code: [email or web address] URL: [email or web address] Date: 09/17/2022-last updated Prepared by: [facility]  Exercises - Sit to Stand  - 1-2 x daily - 5 x weekly - 2 sets - 5-10 reps - Single Leg Stance with Support  - 1 x daily - 5 x weekly - 1 sets - 3 reps - 10 sec hold - Standing Hip Abduction with Counter Support  - 1 x daily - 5 x weekly - 2 sets - 10 reps - Standing Hip Extension with Counter Support  - 1 x daily - 5 x  weekly - 2 sets - 10 reps - Standing Marching  - 1 x daily - 5 x weekly - 2 sets - 10 reps   PATIENT EDUCATION: Education details: HEP updates Person educated: Patient and Spouse Education method: Explanation, Demonstration, and Handouts Education comprehension: verbalized understanding and returned demonstration    ------------------------------------------------------------------------------------------------------- Objective measures below from Eval: DIAGNOSTIC FINDINGS: NA  COGNITION: Overall cognitive status: History of cognitive impairments - at baseline   POSTURE: rounded shoulders  LOWER EXTREMITY ROM:   WFL throughout BLEs in sitting  Active  Right Eval Left Eval  Hip flexion    Hip extension    Hip abduction    Hip adduction    Hip internal rotation    Hip external rotation    Knee flexion    Knee extension    Ankle dorsiflexion    Ankle plantarflexion    Ankle inversion    Ankle eversion     (Blank rows = not tested)  LOWER EXTREMITY MMT:    MMT Right Eval Left Eval  Hip flexion 4+ 4+  Hip extension    Hip abduction    Hip adduction    Hip internal rotation    Hip external rotation    Knee flexion 4+ 4+  Knee extension 4+ 4+  Ankle dorsiflexion 4 4+  Ankle plantarflexion    Ankle inversion    Ankle eversion    (Blank rows = not tested)   TRANSFERS: Assistive device utilized:  Uses hands as needed for sit<>stand   Sit to stand: Modified independence Stand to sit: Modified independence  GAIT: Gait pattern: step through pattern, decreased arm swing- Right, decreased arm swing- Left, trendelenburg, and wide BOS Distance walked: 250 ft Assistive device utilized: None Level of assistance: Modified independence Comments: Increased lateral trunk lean/Trendelenburg pattern with increased gait distance  FUNCTIONAL TESTs:  5 times sit to stand: 20.04 sec Timed up and go (TUG): 22.65 2 minute walk test: 250 ft no device  Dynamic Gait Index:  NT Gait velocity:  19.85 sec = 1.65 ft/sec *2MWT norms for 64-79 age group:  70 ft  TODAY'S TREATMENT:  Initiated HEP-see below   PATIENT EDUCATION: Education details: PT eval results, POC, HEP initiated Person educated: Patient and Spouse Education method: Explanation, Demonstration, and Handouts Education comprehension: verbalized understanding and returned demonstration   HOME EXERCISE PROGRAM: Access Code: [email or web address] URL: [email or web address] Date: 09/03/2022 Prepared by: [facility]    GOALS: Goals reviewed with patient? Yes  SHORT TERM GOALS: Target date= LTGs  LONG TERM GOALS: Target date: 10/05/2022  Pt will be supervision with HEP for improved strength, balance, gait. Baseline:  Goal status: IN PROGRESS  2.  Pt will improve 5x sit<>stand to less than or equal to 13 sec to demonstrate improved functional strength and transfer efficiency. Baseline: 20.04 sec Goal status: IN PROGRESS  3.  Pt will improve TUG score to less than or equal to 15 sec for decreased fall risk. Baseline: 22.65 sec Goal status: IN PROGRESS  4.  Pt will improve gait velocity to at least 2 ft/sec for improved gait efficiency and safety. Baseline: 1.65 ft/sec Goal status: IN PROGRESS  5.  Pt will ambulate at least 400 ft in 2 MWT for improved gait effiency and safety with his community walks. Baseline: 250 ft in 2 MWT (normal values = 517 ft) Goal status: IN PROGRESS   ASSESSMENT:  CLINICAL IMPRESSION: Skilled PT session focused on functional strengthening, gait training in session today.  Continued to utilized 2# weights for standing exercises, with pt needing mod-max verbal, visual, tactile cues and extra time for technique.  He does not report any fatigue, but he needs consistent cues for foot clearance, step height, and optimal technique of exercises.  Wife does report he has gotten rollator for home and has used once with family.   He will benefit from skilled PT towards goals for improved overall functional mobility and decreased fall risk.  OBJECTIVE IMPAIRMENTS Abnormal gait, decreased balance, decreased mobility, difficulty walking, decreased strength, and postural dysfunction.   ACTIVITY LIMITATIONS standing, transfers, and locomotion level  PARTICIPATION LIMITATIONS: shopping and community activity  PERSONAL FACTORS 1-2 comorbidities: See PMH; of note:  Azheimer's.  Pt does appear to have good family support.  are also affecting patient's functional outcome.   REHAB POTENTIAL: Good  CLINICAL DECISION MAKING: Evolving/moderate complexity  EVALUATION COMPLEXITY: Moderate  PLAN: PT FREQUENCY: 2x/week  PT DURATION: 4 weeks plus eval = 5 week POC  PLANNED INTERVENTIONS: Therapeutic exercises, Therapeutic activity, Neuromuscular re-education, Balance training, Gait training, Patient/Family education, and Self Care  PLAN FOR NEXT SESSION:   Continue to update HEP as appropriate for lower extremity strengthening, balance, gait.  Gait training/family education on rollator use   Frazier Butt., PT 09/24/2022, 1:16 PM  [facility] at [facility] [address], [place], [place] [address] Phone # [phone] Fax # [phone]

## 2022-10-04 ENCOUNTER — Ambulatory Visit: Payer: Medicare PPO | Admitting: Physical Therapy

## 2022-10-04 DIAGNOSIS — R2681 Unsteadiness on feet: Secondary | ICD-10-CM | POA: Diagnosis not present

## 2022-10-04 DIAGNOSIS — R2689 Other abnormalities of gait and mobility: Secondary | ICD-10-CM

## 2022-10-04 DIAGNOSIS — M6281 Muscle weakness (generalized): Secondary | ICD-10-CM

## 2022-10-04 NOTE — Therapy (Signed)
OUTPATIENT PHYSICAL THERAPY NEURO TREATMENT/RECERT   Patient Name: Robert Hester MRN: [id number] DOB:Jun 22, 1946, 76 y.o., male Today's Date: 10/05/2022   PCP: Antony Contras, MD REFERRING PROVIDER: Glennon Mac, DO   PT End of Session - 10/04/22 1114     Visit Number 5    Number of Visits 11    Date for PT Re-Evaluation 58/09/98   per recert 33/82/5053   Authorization Type [employer]    PT Start Time 1113   pt arrives late   PT Stop Time 1147    PT Time Calculation (min) 34 min    Activity Tolerance Patient tolerated treatment well    Behavior During Therapy [facility] for tasks assessed/performed                No past medical history on file. 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   Memory loss 03/05/2016    ONSET DATE: 08/10/2022  REFERRING DIAG:  G30.9,F02.80 (ICD-10-CM) - Alzheimer's disease ([place])  R53.81 (ICD-10-CM) - Physical deconditioning  Z91.81 (ICD-10-CM) - Risk for falls    THERAPY DIAG:  Muscle weakness (generalized) - Plan: PT plan of care cert/re-cert  Unsteadiness on feet - Plan: PT plan of care cert/re-cert  Other abnormalities of gait and mobility - Plan: PT plan of care cert/re-cert  Rationale for Evaluation and Treatment Rehabilitation  SUBJECTIVE:                                                                                                                                                                                              SUBJECTIVE STATEMENT: No falls, no stumbles.  Everyday is better than the  last.  My son helps me with the exercises.  Wife reports pt has had several episodes of dizziness-sometimes coming on when lying in bed.  No dizziness today.  Pt accompanied by: significant other  PERTINENT HISTORY: See above; Alzheimer's diagnosis  PAIN:  Are you having pain? No  PRECAUTIONS: Fall and Other: No driving  WEIGHT BEARING RESTRICTIONS No  FALLS: Has patient fallen in last 6 months? Yes. Number of falls 2-3  LIVING ENVIRONMENT: Lives with: lives with their spouse Lives in: House/apartment Stairs: Yes: External: 2-3  steps; on right going up Has following equipment at home: Single point cane, Walker - 2 wheeled, and does not typically use any device  PLOF: Independent and enjoys walking in the neighborhood  Has started going to [employer] for use of machines.  PATIENT GOALS Goals would be to improve mobility.  OBJECTIVE:    TODAY'S TREATMENT: 10/03/2022 Activity Comments  5x sit<>stand:  15.28 sec; 12.59 sec Improved from 20.04 sec at eval  TUG:  21.16 sec, 18.75 sec, 18.41 sec (avg = 19.44 sec) Improved from 22.65 sec at eval  Gait velocity:  13.41 sec (2.44 ft/sec) Improved from 1.65 ft/sec  Reviewed HEP-standing hip abduction, extension, marching in place 2# >3# weights, 2 x 10 reps Cues for slow pace; provided new printout per wife's request         Access Code: [email or web address] URL: [email or web address] Date: 10/04/2022-most recent update Prepared by: [facility]  Exercises - Sit to Stand  - 1-2 x daily - 5 x weekly - 2 sets - 5-10 reps - Single Leg Stance with Support  - 1 x daily - 5 x weekly - 1 sets - 3 reps - 10 sec hold - Standing Hip Abduction with Counter Support  - 1 x daily - 5 x weekly - 2 sets - 10 reps - Standing Hip Extension with Counter Support  - 1 x daily - 5 x weekly - 2 sets - 10 reps - Standing Marching  - 1 x daily - 5 x weekly - 2 sets - 10 reps - Heel Toe Raises with Counter Support  -  1 x daily - 5 x weekly - 3 sets - 10 reps  PATIENT EDUCATION: Education details: Progress towards goals, POC discussion, with pt/wife/PT in agreement for recert today.  Addition to HEP Person educated: Patient and Spouse Education method: Explanation, Demonstration, and Handouts Education comprehension: verbalized understanding and returned demonstration    ------------------------------------------------------------------------------------------------------- Objective measures below from Eval: DIAGNOSTIC FINDINGS: NA  COGNITION: Overall cognitive status: History of cognitive impairments - at baseline   POSTURE: rounded shoulders  LOWER EXTREMITY ROM:   WFL throughout BLEs in sitting  Active  Right Eval Left Eval  Hip flexion    Hip extension    Hip abduction    Hip adduction    Hip internal rotation    Hip external rotation    Knee flexion    Knee extension    Ankle dorsiflexion    Ankle plantarflexion    Ankle inversion    Ankle eversion     (Blank rows = not tested)  LOWER EXTREMITY MMT:    MMT Right Eval Left Eval  Hip flexion 4+ 4+  Hip extension    Hip abduction    Hip adduction    Hip internal rotation    Hip external rotation    Knee flexion 4+ 4+  Knee extension 4+ 4+  Ankle dorsiflexion 4 4+  Ankle plantarflexion    Ankle inversion    Ankle eversion    (Blank rows = not tested)   TRANSFERS: Assistive device utilized:  Uses hands as needed for sit<>stand   Sit to stand: Modified independence Stand to sit: Modified independence  GAIT: Gait pattern: step through pattern, decreased arm swing- Right, decreased arm swing- Left, trendelenburg, and wide BOS Distance walked: 250 ft Assistive device utilized: None Level of assistance: Modified independence Comments: Increased lateral trunk lean/Trendelenburg pattern with increased gait distance  FUNCTIONAL TESTs:  5 times  sit to stand: 20.04 sec Timed up and go (TUG): 22.65 2 minute walk test:  250 ft no device Dynamic Gait Index: NT Gait velocity:  19.85 sec = 1.65 ft/sec *2MWT norms for 11-79 age group:  50 ft  TODAY'S TREATMENT:  Initiated HEP-see below   PATIENT EDUCATION: Education details: PT eval results, POC, HEP initiated Person educated: Patient and Spouse Education method: Explanation, Demonstration, and Handouts Education comprehension: verbalized understanding and returned demonstration   HOME EXERCISE PROGRAM: Access Code: [email or web address] URL: [email or web address] Date: 09/03/2022 Prepared by: [facility]    GOALS: Goals reviewed with patient? Yes  SHORT TERM GOALS: Target date= LTGs  LONG TERM GOALS: Target date: 10/05/2022  Pt will be supervision with HEP for improved strength, balance, gait. Baseline:  Goal status: IN PROGRESS  2.  Pt will improve 5x sit<>stand to less than or equal to 13 sec to demonstrate improved functional strength and transfer efficiency. Baseline: 20.04 sec>12.59 sec 10/04/2022 Goal status: GOAL MET  3.  Pt will improve TUG score to less than or equal to 15 sec for decreased fall risk. Baseline: 22.65 sec; 19.65 sec avg 10/04/2022 Goal status: GOAL PARTIALLY MET  4.  Pt will improve gait velocity to at least 2 ft/sec for improved gait efficiency and safety. Baseline: 1.65 ft/sec>2.45 ft/sec 10/04/2022 Goal status: GOAL MET  5.  Pt will ambulate at least 400 ft in 2 MWT for improved gait effiency and safety with his community walks. Baseline: 250 ft in 2 MWT (normal values = 517 ft)>310 ft in 2 MWT 10/04/2022 Goal status: GOAL PARTIALLY MET  UPDATED 54/08/8118 Recert: LONG TERM GOALS: Target date: 11/16/2022   Pt will be supervision with HEP for improved strength, balance, transfers, and gait. Baseline:  Goal status: GOAL ONGOING  2.  Pt will improve 5x sit<>stand to less than or equal to 11.5 sec to demonstrate improved functional strength and transfer  efficiency. Baseline: 12.59 sec at best 10/04/2022 Goal status: INITIAL  3.  Pt will improve TUG score to less than or equal to 15 sec for decreased fall risk. Baseline: 19.65 sec 10/04/2022 Goal status: GOAL ONGOING  4.  Pt will ambulate at least 400 ft in 2 MWT for improved gait effiency and safety with his community walks. Baseline: 310 ft 10/04/2022 Goal status: GOAL ONGOING  5.  Pt/family will verbalize options for optimal community fitness upon d/c from PT. Baseline:  Goal status: INITIAL     ASSESSMENT:  CLINICAL IMPRESSION: Assessed LTGs this visit, with pt meeting 2 of 5 LTGs.  LTG 1 is ongoing for continued HEP.  LET 2 met for improved functional strength with FTSTS test.  LTG 4 met for improved gait velocity.  LTG 3 and 5 partially met, with improvements in TUG and 2MWT scores, just not to goal level.  Pt's frequency is 2x/wk, and pt/family have mostly made 1x/wk due to their scheduling preferences.  According to wife's report, pt's son is assisting pt with outdoor gait and his HEP.  Pt is progressing towards goals; however, he is still at fall risk per TUG score and will therefore benefit from further skilled PT towards improved functional strength, balance, mobility and decreased fall risk.  OBJECTIVE IMPAIRMENTS Abnormal gait, decreased balance, decreased mobility, difficulty walking, decreased strength, and postural dysfunction.   ACTIVITY LIMITATIONS standing, transfers, and locomotion level  PARTICIPATION LIMITATIONS: shopping and community activity  PERSONAL FACTORS 1-2 comorbidities: See PMH; of note:  Azheimer's.  Pt does  appear to have good family support.  are also affecting patient's functional outcome.   REHAB POTENTIAL: Good  CLINICAL DECISION MAKING: Evolving/moderate complexity  EVALUATION COMPLEXITY: Moderate  PLAN: PT FREQUENCY: 1x/wk (reduction in frequency to 1x)  PT DURATION: 6 weeks per recert 63/86/8548  PLANNED INTERVENTIONS: Therapeutic  exercises, Therapeutic activity, Neuromuscular re-education, Balance training, Gait training, Patient/Family education, Self Care, Vestibular training, and Canalith repositioning  PLAN FOR NEXT SESSION:   Continue to update and progress HEP as appropriate for lower extremity strengthening, balance, gait.  Gait training/family education on rollator use.  Work on distance gait and discuss optimal fitness transition to gym (pt already going, but to know what to use/how to progress upon d/c from PT). *Based on pt's c/o dizziness at recert, may need to further assess for positional vertigo.   Frazier Butt., PT 10/05/2022, 10:49 AM  [facility] at [facility] [address], [place], [place] [address] Phone # [phone] Fax # [phone]

## 2022-10-09 ENCOUNTER — Ambulatory Visit: Payer: Medicare PPO | Admitting: Physical Therapy

## 2022-10-09 DIAGNOSIS — H81399 Other peripheral vertigo, unspecified ear: Secondary | ICD-10-CM | POA: Diagnosis not present

## 2022-10-09 DIAGNOSIS — G309 Alzheimer's disease, unspecified: Secondary | ICD-10-CM | POA: Diagnosis not present

## 2022-10-10 DIAGNOSIS — M792 Neuralgia and neuritis, unspecified: Secondary | ICD-10-CM | POA: Diagnosis not present

## 2022-10-10 DIAGNOSIS — M21961 Unspecified acquired deformity of right lower leg: Secondary | ICD-10-CM | POA: Diagnosis not present

## 2022-10-10 DIAGNOSIS — I739 Peripheral vascular disease, unspecified: Secondary | ICD-10-CM | POA: Diagnosis not present

## 2022-10-10 DIAGNOSIS — B351 Tinea unguium: Secondary | ICD-10-CM | POA: Diagnosis not present

## 2022-10-10 DIAGNOSIS — M21962 Unspecified acquired deformity of left lower leg: Secondary | ICD-10-CM | POA: Diagnosis not present

## 2022-10-10 DIAGNOSIS — E1151 Type 2 diabetes mellitus with diabetic peripheral angiopathy without gangrene: Secondary | ICD-10-CM | POA: Diagnosis not present

## 2022-10-17 ENCOUNTER — Ambulatory Visit: Payer: Medicare PPO | Attending: Sports Medicine | Admitting: Physical Therapy

## 2022-10-17 ENCOUNTER — Encounter: Payer: Self-pay | Admitting: Physical Therapy

## 2022-10-17 DIAGNOSIS — M6281 Muscle weakness (generalized): Secondary | ICD-10-CM | POA: Diagnosis not present

## 2022-10-17 DIAGNOSIS — R2681 Unsteadiness on feet: Secondary | ICD-10-CM | POA: Diagnosis not present

## 2022-10-17 DIAGNOSIS — R2689 Other abnormalities of gait and mobility: Secondary | ICD-10-CM | POA: Insufficient documentation

## 2022-10-17 NOTE — Therapy (Signed)
OUTPATIENT PHYSICAL THERAPY NEURO TREATMENT/RECERT   Patient Name: Robert Hester MRN: [id number] DOB:[birth year], 76 y.o., male Today's Date: 10/17/2022   PCP: Antony Contras, MD REFERRING PROVIDER: Glennon Mac, DO   PT End of Session - 10/17/22 1408     Visit Number 6    Number of Visits 11    Date for PT Re-Evaluation 76/73/41   per recert 93/79/0240   Authorization Type [employer]    PT Start Time 9735    PT Stop Time 1446    PT Time Calculation (min) 42 min    Equipment Utilized During Treatment Gait belt    Activity Tolerance Patient tolerated treatment well    Behavior During Therapy [facility] for tasks assessed/performed                 History reviewed. No pertinent past medical history. 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/2019   Memory loss 03/05/2016    ONSET DATE: 08/10/2022  REFERRING DIAG:  G30.9,F02.80 (ICD-10-CM) - Alzheimer's disease ([place])  R53.81 (ICD-10-CM) - Physical deconditioning  Z91.81 (ICD-10-CM) - Risk for falls    THERAPY DIAG:  Muscle weakness (generalized)  Unsteadiness on feet  Other abnormalities of gait and mobility  Rationale for Evaluation and Treatment Rehabilitation  SUBJECTIVE:                                                                                                                                                                                              SUBJECTIVE STATEMENT: No changes since last visit.  Better every day.  Wife reports no other episodes of dizziness since mentioned at  last visit. Pt accompanied by: significant other  PERTINENT HISTORY: See above; Alzheimer's diagnosis  PAIN:  Are you having pain? No  PRECAUTIONS: Fall and Other: No driving  WEIGHT BEARING RESTRICTIONS No  FALLS: Has patient fallen in last 6 months? Yes. Number of falls 2-3  LIVING ENVIRONMENT: Lives with: lives with their spouse Lives in: House/apartment Stairs: Yes: External: 2-3 steps; on right going up Has following equipment at home: Single point cane, Walker - 2 wheeled, and does not typically use any device  PLOF: Independent and enjoys walking in the  neighborhood  Has started going to [employer] for use of machines.  PATIENT GOALS Goals would be to improve mobility.  OBJECTIVE:    TODAY'S TREATMENT: 10/17/2022 Activity Comments  NuStep, Level 4, 4 extremities x 3 minutes, then BLEs only x 3 minutes for lower extremity strengthening Cues to keep SPM >80-90  Heel toe raises 2 x 10 reps Cues for full toe raise  Sidestep R and L at counter, 2# weights at ankles BUE support, cues for technique  Monster walks forward, 6 reps along counter, with targets for increased single limb stance, 2# weights 1 UE support and min guard  Forward/back step over obstacle x 10, then side step over obstacle, x 10, 2# weight UE support at counter, cues for upright posture, to decrease UE reliance  SLS at counter, 3 reps, 10 seconds 1 UE support    Access Code: [email or web address] URL: [email or web address] Date: 10/17/2022-updates Prepared by: [facility]  Exercises - Sit to Stand  - 1-2 x daily - 5 x weekly - 2 sets - 5-10 reps - Single Leg Stance with Support  - 1 x daily - 5 x weekly - 1 sets - 3 reps - 10 sec hold - Standing Hip Abduction with Counter Support  - 1 x daily - 5 x weekly - 2 sets - 10 reps - Standing Hip Extension with Counter Support  - 1 x daily - 5 x weekly - 2 sets - 10 reps - Standing Marching  - 1 x daily - 5 x weekly - 2  sets - 10 reps - Heel Toe Raises with Counter Support  - 1 x daily - 5 x weekly - 3 sets - 10 reps - Side Stepping with Counter Support  - 1 x daily - 5 x weekly - 1 sets - 3-5 reps  PATIENT EDUCATION: Education details: HEP addition Person educated: Patient and Spouse Education method: Explanation, Demonstration, and Handouts Education comprehension: verbalized understanding and returned demonstration   ------------------------------------------------------------------------------------------------------- Objective measures below from Eval: DIAGNOSTIC FINDINGS: NA  COGNITION: Overall cognitive status: History of cognitive impairments - at baseline   POSTURE: rounded shoulders  LOWER EXTREMITY ROM:   WFL throughout BLEs in sitting  Active  Right Eval Left Eval  Hip flexion    Hip extension    Hip abduction    Hip adduction    Hip internal rotation    Hip external rotation    Knee flexion    Knee extension    Ankle dorsiflexion    Ankle plantarflexion    Ankle inversion    Ankle eversion     (Blank rows = not tested)  LOWER EXTREMITY MMT:    MMT Right Eval Left Eval  Hip flexion 4+ 4+  Hip extension    Hip abduction    Hip adduction    Hip internal rotation    Hip external rotation    Knee flexion 4+ 4+  Knee extension 4+ 4+  Ankle dorsiflexion 4 4+  Ankle plantarflexion    Ankle inversion    Ankle eversion    (Blank rows = not tested)   TRANSFERS: Assistive device utilized:  Uses hands as needed for sit<>stand   Sit to stand: Modified independence Stand to sit: Modified independence  GAIT: Gait pattern: step through pattern, decreased arm swing- Right, decreased arm swing- Left, trendelenburg, and wide BOS Distance walked: 250 ft Assistive device utilized: None Level of assistance: Modified independence Comments: Increased lateral trunk lean/Trendelenburg  pattern with increased gait distance  FUNCTIONAL TESTs:  5 times sit to stand: 20.04  sec Timed up and go (TUG): 22.65 2 minute walk test: 250 ft no device Dynamic Gait Index: NT Gait velocity:  19.85 sec = 1.65 ft/sec *2MWT norms for 58-79 age group:  63 ft  TODAY'S TREATMENT:  Initiated HEP-see below   PATIENT EDUCATION: Education details: PT eval results, POC, HEP initiated Person educated: Patient and Spouse Education method: Explanation, Demonstration, and Handouts Education comprehension: verbalized understanding and returned demonstration   HOME EXERCISE PROGRAM: Access Code: [email or web address] URL: [email or web address] Date: 09/03/2022 Prepared by: [facility]    GOALS: Goals reviewed with patient? Yes  SHORT TERM GOALS: Target date= LTGs  LONG TERM GOALS: Target date: 10/05/2022  Pt will be supervision with HEP for improved strength, balance, gait. Baseline:  Goal status: IN PROGRESS  2.  Pt will improve 5x sit<>stand to less than or equal to 13 sec to demonstrate improved functional strength and transfer efficiency. Baseline: 20.04 sec>12.59 sec 10/04/2022 Goal status: GOAL MET  3.  Pt will improve TUG score to less than or equal to 15 sec for decreased fall risk. Baseline: 22.65 sec; 19.65 sec avg 10/04/2022 Goal status: GOAL PARTIALLY MET  4.  Pt will improve gait velocity to at least 2 ft/sec for improved gait efficiency and safety. Baseline: 1.65 ft/sec>2.45 ft/sec 10/04/2022 Goal status: GOAL MET  5.  Pt will ambulate at least 400 ft in 2 MWT for improved gait effiency and safety with his community walks. Baseline: 250 ft in 2 MWT (normal values = 517 ft)>310 ft in 2 MWT 10/04/2022 Goal status: GOAL PARTIALLY MET  UPDATED 09/08/3006 Recert: LONG TERM GOALS: Target date: 11/16/2022   Pt will be supervision with HEP for improved strength, balance, transfers, and gait. Baseline:  Goal status: GOAL ONGOING  2.  Pt will improve 5x sit<>stand to less than or equal to 11.5 sec to demonstrate  improved functional strength and transfer efficiency. Baseline: 12.59 sec at best 10/04/2022 Goal status: INITIAL  3.  Pt will improve TUG score to less than or equal to 15 sec for decreased fall risk. Baseline: 19.65 sec 10/04/2022 Goal status: GOAL ONGOING  4.  Pt will ambulate at least 400 ft in 2 MWT for improved gait effiency and safety with his community walks. Baseline: 310 ft 10/04/2022 Goal status: GOAL ONGOING  5.  Pt/family will verbalize options for optimal community fitness upon d/c from PT. Baseline:  Goal status: INITIAL     ASSESSMENT:  CLINICAL IMPRESSION: Skilled PT session today focused on continued lower extremity strengthening for improved single limb stance, for improved step length and foot clearance.  Pt continues to come into/out of therapy sessions without device, but reports walking regularly with his son.  With exercises, he requires 1 UE support at least, and at times, tends to lean onto counter with heavy BUE support.  With cues and attention to task, he is able to ambulate short distances in clinic without device, with increased step length, but reverts back to shorter step length after about 10-15 ft if cues removed.  He tolerates exercises well without complaints today; he will continue to benefit from skilled PT towards goals for improved overall functional mobility and decreased fall risk.  OBJECTIVE IMPAIRMENTS Abnormal gait, decreased balance, decreased mobility, difficulty walking, decreased strength, and postural dysfunction.   ACTIVITY LIMITATIONS standing, transfers, and locomotion level  PARTICIPATION LIMITATIONS: shopping and community activity  PERSONAL FACTORS 1-2 comorbidities: See PMH; of note:  Azheimer's.  Pt does appear to have good family support.  are also affecting patient's functional outcome.   REHAB POTENTIAL: Good  CLINICAL DECISION MAKING: Evolving/moderate complexity  EVALUATION COMPLEXITY: Moderate  PLAN: PT FREQUENCY:  1x/wk (reduction in frequency to 1x)  PT DURATION: 6 weeks per recert 70/10/33  PLANNED INTERVENTIONS: Therapeutic exercises, Therapeutic activity, Neuromuscular re-education, Balance training, Gait training, Patient/Family education, Self Care, Vestibular training, and Canalith repositioning  PLAN FOR NEXT SESSION:   Continue to update and progress HEP as appropriate for lower extremity strengthening, balance, gait.  Gait training/family education on rollator use.  Work on distance gait and discuss optimal fitness transition to gym (pt already going, but to know what to use/how to progress upon d/c from PT). *Based on pt's c/o dizziness at recert, may need to further assess for positional vertigo.   Frazier Butt., PT 10/17/2022, 3:00 PM  [facility] at [facility], [place], [place] [address] Phone # [phone] Fax # [phone]

## 2022-10-29 ENCOUNTER — Encounter: Payer: Self-pay | Admitting: Physical Therapy

## 2022-10-29 ENCOUNTER — Ambulatory Visit: Payer: Medicare PPO | Admitting: Physical Therapy

## 2022-10-29 DIAGNOSIS — M6281 Muscle weakness (generalized): Secondary | ICD-10-CM | POA: Diagnosis not present

## 2022-10-29 DIAGNOSIS — R2681 Unsteadiness on feet: Secondary | ICD-10-CM

## 2022-10-29 DIAGNOSIS — R2689 Other abnormalities of gait and mobility: Secondary | ICD-10-CM | POA: Diagnosis not present

## 2022-10-29 NOTE — Therapy (Addendum)
OUTPATIENT PHYSICAL THERAPY NEURO TREATMENT   Patient Name: Robert Hester MRN: [id number] DOB:Apr 05, 1946, 76 y.o., male Today's Date: 10/29/2022   PCP: Antony Contras, MD REFERRING PROVIDER: Glennon Mac, DO   PT End of Session - 10/29/22 1338     Visit Number 7    Number of Visits 11    Date for PT Re-Evaluation 80/32/12   per recert 24/82/5003   Authorization Type [employer]    PT Start Time 7048   pt arrives late   PT Stop Time 1401    PT Time Calculation (min) 32 min    Equipment Utilized During Treatment Gait belt    Activity Tolerance Patient tolerated treatment well    Behavior During Therapy [facility] for tasks assessed/performed                 History reviewed. No pertinent past medical history. 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/2019   Memory loss 03/05/2016    ONSET DATE: 08/10/2022  REFERRING DIAG:  G30.9,F02.80 (ICD-10-CM) - Alzheimer's disease ([place])  R53.81 (ICD-10-CM) - Physical deconditioning  Z91.81 (ICD-10-CM) - Risk for falls    THERAPY DIAG:  Muscle weakness (generalized)  Unsteadiness on feet  Other abnormalities of gait and mobility  Rationale for Evaluation and Treatment Rehabilitation  SUBJECTIVE:                                                                                                                                                                                              SUBJECTIVE STATEMENT: Had a [place] time at the beach.  No falls. Pt accompanied by: significant other  PERTINENT HISTORY:  See above; Alzheimer's diagnosis  PAIN:  Are you having pain? No  PRECAUTIONS: Fall and Other: No driving  WEIGHT BEARING RESTRICTIONS No  FALLS: Has patient fallen in last 6 months? Yes. Number of falls 2-3  LIVING ENVIRONMENT: Lives with: lives with their spouse Lives in: House/apartment Stairs: Yes: External: 2-3 steps; on right going up Has following equipment at home: Single point cane, Walker - 2 wheeled, and does not typically use any device  PLOF: Independent and enjoys walking in the neighborhood  Has started going to [employer] for  use of machines.  PATIENT GOALS Goals would be to improve mobility.  OBJECTIVE:    TODAY'S TREATMENT: 10/29/2022 Activity Comments  Seated exercises for warm-up: Marching  LAQ Step out/in 3# weights, 2 x 10 reps     Sidestep R and L at counter, 2# weights at ankles, 3 reps BUE support, cues for technique-Reviewed from HEP, pt return demo with cues  Sit<>Stand from mat x 10 reps, then 2nd set of 10 standing on [employer] min guard  Standing forward hip kicks to target, 2 x 10 reps   Gait training with rollator, 85 ft, then 150 ft Supervision and cues for increased step length; cues to lessen UE support on rollator handles.       Access Code: [email or web address] URL: [email or web address] Date: 10/17/2022-updates Prepared by: [facility]  Exercises - Sit to Stand  - 1-2 x daily - 5 x weekly - 2 sets - 5-10 reps - Single Leg Stance with Support  - 1 x daily - 5 x weekly - 1 sets - 3 reps - 10 sec hold - Standing Hip Abduction with Counter Support  - 1 x daily - 5 x weekly - 2 sets - 10 reps - Standing Hip Extension with Counter Support  - 1 x daily - 5 x weekly - 2 sets - 10 reps - Standing Marching  - 1 x daily - 5 x weekly - 2 sets - 10 reps - Heel Toe Raises with Counter Support  - 1 x daily - 5 x weekly - 3 sets - 10 reps - Side Stepping with Counter Support  - 1 x daily - 5 x weekly - 1 sets -  3-5 reps   ------------------------------------------------------------------------------------------------------- Objective measures below from Eval: DIAGNOSTIC FINDINGS: NA  COGNITION: Overall cognitive status: History of cognitive impairments - at baseline   POSTURE: rounded shoulders  LOWER EXTREMITY ROM:   WFL throughout BLEs in sitting  Active  Right Eval Left Eval  Hip flexion    Hip extension    Hip abduction    Hip adduction    Hip internal rotation    Hip external rotation    Knee flexion    Knee extension    Ankle dorsiflexion    Ankle plantarflexion    Ankle inversion    Ankle eversion     (Blank rows = not tested)  LOWER EXTREMITY MMT:    MMT Right Eval Left Eval  Hip flexion 4+ 4+  Hip extension    Hip abduction    Hip adduction    Hip internal rotation    Hip external rotation    Knee flexion 4+ 4+  Knee extension 4+ 4+  Ankle dorsiflexion 4 4+  Ankle plantarflexion    Ankle inversion    Ankle eversion    (Blank rows = not tested)   TRANSFERS: Assistive device utilized:  Uses hands as needed for sit<>stand   Sit to stand: Modified independence Stand to sit: Modified independence  GAIT: Gait pattern: step through pattern, decreased arm swing- Right, decreased arm swing- Left, trendelenburg, and wide BOS Distance walked: 250 ft Assistive device utilized: None Level of assistance: Modified independence Comments: Increased lateral trunk lean/Trendelenburg pattern with increased gait distance  FUNCTIONAL TESTs:  5 times sit to stand: 20.04 sec Timed up and go (TUG): 22.65 2 minute walk test: 250 ft no device Dynamic Gait Index: NT Gait velocity:  19.85 sec = 1.65 ft/sec *2MWT norms for 50-79 age  group:  517 ft  TODAY'S TREATMENT:  Initiated HEP-see below   PATIENT EDUCATION: Education details: PT eval results, POC, HEP initiated Person educated: Patient and Spouse Education method: Explanation, Demonstration, and  Handouts Education comprehension: verbalized understanding and returned demonstration   HOME EXERCISE PROGRAM: Access Code: [email or web address] URL: [email or web address] Date: 09/03/2022 Prepared by: [facility]    GOALS: Goals reviewed with patient? Yes  SHORT TERM GOALS: Target date= LTGs  LONG TERM GOALS: Target date: 10/05/2022  Pt will be supervision with HEP for improved strength, balance, gait. Baseline:  Goal status: IN PROGRESS  2.  Pt will improve 5x sit<>stand to less than or equal to 13 sec to demonstrate improved functional strength and transfer efficiency. Baseline: 20.04 sec>12.59 sec 10/04/2022 Goal status: GOAL MET  3.  Pt will improve TUG score to less than or equal to 15 sec for decreased fall risk. Baseline: 22.65 sec; 19.65 sec avg 10/04/2022 Goal status: GOAL PARTIALLY MET  4.  Pt will improve gait velocity to at least 2 ft/sec for improved gait efficiency and safety. Baseline: 1.65 ft/sec>2.45 ft/sec 10/04/2022 Goal status: GOAL MET  5.  Pt will ambulate at least 400 ft in 2 MWT for improved gait effiency and safety with his community walks. Baseline: 250 ft in 2 MWT (normal values = 517 ft)>310 ft in 2 MWT 10/04/2022 Goal status: GOAL PARTIALLY MET  UPDATED 24/08/7352 Recert: LONG TERM GOALS: Target date: 11/16/2022   Pt will be supervision with HEP for improved strength, balance, transfers, and gait. Baseline:  Goal status: GOAL ONGOING  2.  Pt will improve 5x sit<>stand to less than or equal to 11.5 sec to demonstrate improved functional strength and transfer efficiency. Baseline: 12.59 sec at best 10/04/2022 Goal status: INITIAL  3.  Pt will improve TUG score to less than or equal to 15 sec for decreased fall risk. Baseline: 19.65 sec 10/04/2022 Goal status: GOAL ONGOING  4.  Pt will ambulate at least 400 ft in 2 MWT for improved gait effiency and safety with his community walks. Baseline: 310 ft  10/04/2022 Goal status: GOAL ONGOING  5.  Pt/family will verbalize options for optimal community fitness upon d/c from PT. Baseline:  Goal status: INITIAL     ASSESSMENT:  CLINICAL IMPRESSION: Pt arrives late for PT session today; focused primarily on lower extremity strength and gait.  Utilized ankle weights and targets for cues to increase step height and foot clearance.  Following exercises to work on foot clearance, progressed to gait activities with rollator, and pt is able to keep long strides, step length, and foot clearance with Vcs only.  Discussed with wife that using rollator may help with overall improved foot clearance.  They are working to be more consistent going to gym, with goal to be 5x/wk.  He will continue to benefit from skilled PT towards goals for improved overall gait and functional mobility and decreased fall risk.  OBJECTIVE IMPAIRMENTS Abnormal gait, decreased balance, decreased mobility, difficulty walking, decreased strength, and postural dysfunction.   ACTIVITY LIMITATIONS standing, transfers, and locomotion level  PARTICIPATION LIMITATIONS: shopping and community activity  PERSONAL FACTORS 1-2 comorbidities: See PMH; of note:  Azheimer's.  Pt does appear to have good family support.  are also affecting patient's functional outcome.   REHAB POTENTIAL: Good  CLINICAL DECISION MAKING: Evolving/moderate complexity  EVALUATION COMPLEXITY: Moderate  PLAN: PT FREQUENCY: 1x/wk (reduction in frequency to 1x)  PT DURATION: 6 weeks per recert 29/92/4268  PLANNED INTERVENTIONS: Therapeutic exercises, Therapeutic activity, Neuromuscular re-education, Balance training, Gait training, Patient/Family education, Self Care, Vestibular training, and Canalith repositioning  PLAN FOR NEXT SESSION:   Continue to update and progress HEP as appropriate for lower extremity strengthening, balance, gait.  Gait training/family education on rollator use.  Work on distance gait  and discuss optimal fitness transition to gym (pt already going, but to know what to use/how to progress upon d/c from PT). *Based on pt's c/o dizziness at recert, may need to further assess for positional vertigo.   Frazier Butt., PT 10/29/2022, 4:06 PM  [facility] at [facility], [place], [place] [address] Phone # [phone] Fax # [phone]

## 2022-10-30 ENCOUNTER — Other Ambulatory Visit: Payer: Self-pay | Admitting: Neurology

## 2022-10-30 DIAGNOSIS — F0280 Dementia in other diseases classified elsewhere without behavioral disturbance: Secondary | ICD-10-CM

## 2022-11-05 ENCOUNTER — Ambulatory Visit: Payer: Medicare PPO | Admitting: Physical Therapy

## 2022-11-05 ENCOUNTER — Encounter: Payer: Self-pay | Admitting: Physical Therapy

## 2022-11-05 DIAGNOSIS — M6281 Muscle weakness (generalized): Secondary | ICD-10-CM | POA: Diagnosis not present

## 2022-11-05 DIAGNOSIS — R2681 Unsteadiness on feet: Secondary | ICD-10-CM | POA: Diagnosis not present

## 2022-11-05 DIAGNOSIS — R2689 Other abnormalities of gait and mobility: Secondary | ICD-10-CM | POA: Diagnosis not present

## 2022-11-05 NOTE — Therapy (Signed)
OUTPATIENT PHYSICAL THERAPY NEURO TREATMENT   Patient Name: Robert Hester MRN: [id number] DOB:01/08/1946, 76 y.o., male Today's Date: 11/05/2022   PCP: Antony Contras, MD REFERRING PROVIDER: Glennon Mac, DO   PT End of Session - 11/05/22 1107     Visit Number 8    Number of Visits 11    Date for PT Re-Evaluation 25/85/27   per recert 78/24/2353   Authorization Type [employer]    PT Start Time 1107    PT Stop Time 1147    PT Time Calculation (min) 40 min    Equipment Utilized During Treatment Gait belt    Activity Tolerance Patient tolerated treatment well    Behavior During Therapy [facility] for tasks assessed/performed                 History reviewed. No pertinent past medical history. 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/2019   Memory loss 03/05/2016    ONSET DATE: 08/10/2022  REFERRING DIAG:  G30.9,F02.80 (ICD-10-CM) - Alzheimer's disease ([place])  R53.81 (ICD-10-CM) - Physical deconditioning  Z91.81 (ICD-10-CM) - Risk for falls    THERAPY DIAG:  Muscle weakness (generalized)  Unsteadiness on feet  Rationale for Evaluation and Treatment Rehabilitation  SUBJECTIVE:                                                                                                                                                                                              SUBJECTIVE STATEMENT: Son reports he is helping him do the exercises and walking.   Pt accompanied by: son, Dian Situ  PERTINENT HISTORY: See above; Alzheimer's diagnosis  PAIN:  Are  you having pain? No  PRECAUTIONS: Fall and Other: No driving  WEIGHT BEARING RESTRICTIONS No  FALLS: Has patient fallen in last 6 months? Yes. Number of falls 2-3  LIVING ENVIRONMENT: Lives with: lives with their spouse Lives in: House/apartment Stairs: Yes: External: 2-3 steps; on right going up Has following equipment at home: Single point cane, Walker - 2 wheeled, and does not typically use any device  PLOF: Independent and enjoys walking in the neighborhood  Has started going to [employer] for use of machines.  PATIENT GOALS Goals would  be to improve mobility.  OBJECTIVE:   [facility] PT Treatment:                                                DATE: 11/05/2022 Therapeutic Exercise: Son present during session and reviewed through/worked with son to progress HEP to include resistance for home.  Son reports they don't have ankle weights-likely in storage, so trialed theraband today. Sit<>stand x 10 reps for warm-up for lower extremity strengthening The following exercises performed with green theraband: LAQ x 10 reps Seated march x 10 reps Standing hip abduction x 10 reps Standing hip extension x 10 reps Standing resisted sidestepping x 4 reps Cues provided (tactile, verbal, visual) for technique, posture, breathing  Gait: Gait training using rollator, 40 ft indoors, then 60 ft x 2 outdoors, sidewalk area Supervision/ min guard and cues to stay close to rollator; PT uses cues "keep your feet under the seat" or suggested a taped stripe on the seat for a visual cue for step length.  Son demonstrates tactile cues for posture and stopping gait/restart with good posture to stay close to rollator    PATIENT EDUCATION: Education details: Educated pt/son on HEP, ways to progress (weight versus band); cues for technique, posture, breathing; cues/education for ways to keep pt closer to rollator for longer bouts of walking Person educated: Patient and Child(ren) Education method:  Explanation, Demonstration, Tactile cues, and Verbal cues Education comprehension: verbalized understanding, returned demonstration, tactile cues required, and needs further education    Access Code: [email or web address] URL: [email or web address] Date: 10/17/2022-updates Prepared by: [facility]  Exercises - Sit to Stand  - 1-2 x daily - 5 x weekly - 2 sets - 5-10 reps - Single Leg Stance with Support  - 1 x daily - 5 x weekly - 1 sets - 3 reps - 10 sec hold - Standing Hip Abduction with Counter Support  - 1 x daily - 5 x weekly - 2 sets - 10 reps - Standing Hip Extension with Counter Support  - 1 x daily - 5 x weekly - 2 sets - 10 reps - Standing Marching  - 1 x daily - 5 x weekly - 2 sets - 10 reps - Heel Toe Raises with Counter Support  - 1 x daily - 5 x weekly - 3 sets - 10 reps - Side Stepping with Counter Support  - 1 x daily - 5 x weekly - 1 sets - 3-5 reps   ------------------------------------------------------------------------------------------------------- Objective measures below from Eval: DIAGNOSTIC FINDINGS: NA  COGNITION: Overall cognitive status: History of cognitive impairments - at baseline   POSTURE: rounded shoulders  LOWER EXTREMITY ROM:   WFL throughout BLEs in sitting  Active  Right Eval Left Eval  Hip flexion    Hip extension    Hip abduction    Hip adduction    Hip internal rotation    Hip external rotation    Knee flexion    Knee extension    Ankle dorsiflexion    Ankle plantarflexion    Ankle inversion    Ankle eversion     (Blank rows = not tested)  LOWER EXTREMITY MMT:    MMT Right Eval Left Eval  Hip flexion 4+ 4+  Hip extension    Hip abduction    Hip adduction  Hip internal rotation    Hip external rotation    Knee flexion 4+ 4+  Knee extension 4+ 4+  Ankle dorsiflexion 4 4+  Ankle plantarflexion    Ankle inversion    Ankle eversion    (Blank rows = not  tested)   TRANSFERS: Assistive device utilized:  Uses hands as needed for sit<>stand   Sit to stand: Modified independence Stand to sit: Modified independence  GAIT: Gait pattern: step through pattern, decreased arm swing- Right, decreased arm swing- Left, trendelenburg, and wide BOS Distance walked: 250 ft Assistive device utilized: None Level of assistance: Modified independence Comments: Increased lateral trunk lean/Trendelenburg pattern with increased gait distance  FUNCTIONAL TESTs:  5 times sit to stand: 20.04 sec Timed up and go (TUG): 22.65 2 minute walk test: 250 ft no device Dynamic Gait Index: NT Gait velocity:  19.85 sec = 1.65 ft/sec *2MWT norms for 17-79 age group:  10 ft  TODAY'S TREATMENT:  Initiated HEP-see below   PATIENT EDUCATION: Education details: PT eval results, POC, HEP initiated Person educated: Patient and Spouse Education method: Explanation, Demonstration, and Handouts Education comprehension: verbalized understanding and returned demonstration   HOME EXERCISE PROGRAM: Access Code: [email or web address] URL: [email or web address] Date: 09/03/2022 Prepared by: [facility]    GOALS: Goals reviewed with patient? Yes  SHORT TERM GOALS: Target date= LTGs  LONG TERM GOALS: Target date: 10/05/2022  Pt will be supervision with HEP for improved strength, balance, gait. Baseline:  Goal status: IN PROGRESS  2.  Pt will improve 5x sit<>stand to less than or equal to 13 sec to demonstrate improved functional strength and transfer efficiency. Baseline: 20.04 sec>12.59 sec 10/04/2022 Goal status: GOAL MET  3.  Pt will improve TUG score to less than or equal to 15 sec for decreased fall risk. Baseline: 22.65 sec; 19.65 sec avg 10/04/2022 Goal status: GOAL PARTIALLY MET  4.  Pt will improve gait velocity to at least 2 ft/sec for improved gait efficiency and safety. Baseline: 1.65 ft/sec>2.45 ft/sec  10/04/2022 Goal status: GOAL MET  5.  Pt will ambulate at least 400 ft in 2 MWT for improved gait effiency and safety with his community walks. Baseline: 250 ft in 2 MWT (normal values = 517 ft)>310 ft in 2 MWT 10/04/2022 Goal status: GOAL PARTIALLY MET  UPDATED 46/56/8127 Recert: LONG TERM GOALS: Target date: 11/16/2022   Pt will be supervision with HEP for improved strength, balance, transfers, and gait. Baseline:  Goal status: GOAL ONGOING  2.  Pt will improve 5x sit<>stand to less than or equal to 11.5 sec to demonstrate improved functional strength and transfer efficiency. Baseline: 12.59 sec at best 10/04/2022 Goal status: INITIAL  3.  Pt will improve TUG score to less than or equal to 15 sec for decreased fall risk. Baseline: 19.65 sec 10/04/2022 Goal status: GOAL ONGOING  4.  Pt will ambulate at least 400 ft in 2 MWT for improved gait effiency and safety with his community walks. Baseline: 310 ft 10/04/2022 Goal status: GOAL ONGOING  5.  Pt/family will verbalize options for optimal community fitness upon d/c from PT. Baseline:  Goal status: INITIAL     ASSESSMENT:  CLINICAL IMPRESSION: Pt and son present for PT session today.  Son helps patient with his exercise program and walking, and son is able to verbalize/demo HEP.  Throughout session, either trialed or discussed ways to progress exercises (weights versus band) and to increase to 3 sets of 10 for strengthening  posture and balance musculature.  Son seems to have good grasp of HEP and already cues pt fairly well.  Pt distracted somewhat in busy gym area today and needs more cues than usual for upright posture, decreased reliance on UES and technique of exercises.  He is progressing towards LTGs and will continue to benefit from skilled PT for improved functional mobility and decreased fall risk.    OBJECTIVE IMPAIRMENTS Abnormal gait, decreased balance, decreased mobility, difficulty walking, decreased strength, and  postural dysfunction.   ACTIVITY LIMITATIONS standing, transfers, and locomotion level  PARTICIPATION LIMITATIONS: shopping and community activity  PERSONAL FACTORS 1-2 comorbidities: See PMH; of note:  Azheimer's.  Pt does appear to have good family support.  are also affecting patient's functional outcome.   REHAB POTENTIAL: Good  CLINICAL DECISION MAKING: Evolving/moderate complexity  EVALUATION COMPLEXITY: Moderate  PLAN: PT FREQUENCY: 1x/wk (reduction in frequency to 1x)  PT DURATION: 6 weeks per recert 29/08/300  PLANNED INTERVENTIONS: Therapeutic exercises, Therapeutic activity, Neuromuscular re-education, Balance training, Gait training, Patient/Family education, Self Care, Vestibular training, and Canalith repositioning  PLAN FOR NEXT SESSION:   Ask how theraband is going with exercises.  Need to check LTGs and discuss POC/transition to community fitness/likely d/c.  *Based on pt's c/o dizziness at recert, may need to further assess for positional vertigo.   Frazier Butt., PT 11/05/2022, 12:12 PM  [facility] at [facility] [place], [place], [place] [address] Phone # [phone] Fax # [phone]

## 2022-11-12 ENCOUNTER — Ambulatory Visit: Payer: Medicare PPO | Admitting: Physical Therapy

## 2022-11-12 ENCOUNTER — Encounter: Payer: Self-pay | Admitting: Physical Therapy

## 2022-11-12 DIAGNOSIS — R2681 Unsteadiness on feet: Secondary | ICD-10-CM | POA: Diagnosis not present

## 2022-11-12 DIAGNOSIS — R2689 Other abnormalities of gait and mobility: Secondary | ICD-10-CM

## 2022-11-12 DIAGNOSIS — M6281 Muscle weakness (generalized): Secondary | ICD-10-CM

## 2022-11-12 NOTE — Therapy (Signed)
OUTPATIENT PHYSICAL THERAPY NEURO TREATMENT/DISCHARGE SUMMARY   Patient Name: Robert Hester MRN: [id number] DOB:25-Sep-1946, 76 y.o., male Today's Date: 11/12/2022   PCP: Antony Contras, MD REFERRING PROVIDER: Glennon Mac, DO   PHYSICAL THERAPY DISCHARGE SUMMARY  Visits from Start of Care: 9  Current functional level related to goals / functional outcomes: See LTGs below; pt has met 2 of 5 LTGs.   Remaining deficits: Gait, strength   Education / Equipment: Educated in [employer], community fitness   Patient agrees to discharge. Patient goals were partially met. Patient is being discharged due to being pleased with the current functional level.     PT End of Session - 11/12/22 1214     Visit Number 9    Number of Visits 11    Date for PT Re-Evaluation 23/55/73   per recert 22/01/5426   Authorization Type [employer]    Authorization Time Period 10/04/2022-11/16/2022    Authorization - Visit Number 5    Authorization - Number of Visits 6    PT Start Time 1222    PT Stop Time 1300    PT Time Calculation (min) 38 min    Equipment Utilized During Treatment Gait belt    Activity Tolerance Patient tolerated treatment well    Behavior During Therapy WFL for tasks assessed/performed                 History reviewed. No pertinent past medical history. 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/2019   Memory loss 03/05/2016    ONSET DATE: 08/10/2022  REFERRING DIAG:  G30.9,F02.80 (ICD-10-CM) - Alzheimer's disease ([place])  R53.81 (ICD-10-CM) -  Physical deconditioning  Z91.81 (ICD-10-CM) - Risk for falls    THERAPY DIAG:  Muscle weakness (generalized)  Unsteadiness on feet  Other abnormalities of gait and mobility  Rationale for Evaluation and Treatment Rehabilitation  SUBJECTIVE:                                                                                                                                                                                              SUBJECTIVE STATEMENT: Doing well.  Haven't done a lot of walking, but we plan to walk today. Pt  accompanied by: son, Dian Situ  PERTINENT HISTORY: See above; Alzheimer's diagnosis  PAIN:  Are you having pain? No  PRECAUTIONS: Fall and Other: No driving  WEIGHT BEARING RESTRICTIONS No  FALLS: Has patient fallen in last 6 months? Yes. Number of falls 2-3  LIVING ENVIRONMENT: Lives with: lives with their spouse Lives in: House/apartment Stairs: Yes: External: 2-3 steps; on right going up Has following equipment at home: Single point cane, Walker - 2 wheeled, and does not typically use any device  PLOF: Independent and enjoys walking in the neighborhood  Has started going to [employer] for use of machines.  PATIENT GOALS Goals would be to improve mobility.  OBJECTIVE:    TODAY'S TREATMENT: 11/12/2022 Activity Comments  Sit<>stand:  5 reps from mat surface   5x sit<>Stand:  30.60 sec    TUG score:  25.09 sec   2Min walk test:  240 ft   Lower extremities on [employer] bike, 7 minutes, with intervals of varied speeds, Level 1.5 Cues/instruction for aerobic exercise, speed and resistance     Short distance gait between activities in session today, 20-40 ft, with son's supervision and cues to increase step length, foot clearance     PATIENT EDUCATION: Education details: Reviewed use of band versus weights as ways to progress HEP; importance of consistency of walking program, HEP, aerobic activity; community fitness options Person educated: Patient and  Child(ren) Education method: Explanation, Demonstration, Tactile cues, and Verbal cues Education comprehension: verbalized understanding, returned demonstration, tactile cues required, and needs further education    Access Code: [email or web address] URL: [email or web address] Date: 10/17/2022-updates Prepared by: [facility]  Exercises - Sit to Stand  - 1-2 x daily - 5 x weekly - 2 sets - 5-10 reps - Single Leg Stance with Support  - 1 x daily - 5 x weekly - 1 sets - 3 reps - 10 sec hold - Standing Hip Abduction with Counter Support  - 1 x daily - 5 x weekly - 2 sets - 10 reps - Standing Hip Extension with Counter Support  - 1 x daily - 5 x weekly - 2 sets - 10 reps - Standing Marching  - 1 x daily - 5 x weekly - 2 sets - 10 reps - Heel Toe Raises with Counter Support  - 1 x daily - 5 x weekly - 3 sets - 10 reps - Side Stepping with Counter Support  - 1 x daily - 5 x weekly - 1 sets - 3-5 reps   ------------------------------------------------------------------------------------------------------- Objective measures below from Eval: DIAGNOSTIC FINDINGS: NA  COGNITION: Overall cognitive status: History of cognitive impairments - at baseline   POSTURE: rounded shoulders  LOWER EXTREMITY ROM:   WFL throughout BLEs in sitting  Active  Right Eval Left Eval  Hip flexion    Hip extension    Hip abduction    Hip adduction    Hip internal rotation    Hip external rotation    Knee flexion    Knee extension    Ankle dorsiflexion    Ankle plantarflexion    Ankle inversion    Ankle eversion     (Blank rows = not tested)  LOWER EXTREMITY MMT:    MMT Right Eval Left Eval  Hip flexion 4+ 4+  Hip extension    Hip abduction    Hip adduction    Hip internal rotation    Hip external rotation    Knee flexion 4+ 4+  Knee extension  4+ 4+  Ankle dorsiflexion 4 4+  Ankle plantarflexion    Ankle inversion    Ankle eversion    (Blank rows  = not tested)   TRANSFERS: Assistive device utilized:  Uses hands as needed for sit<>stand   Sit to stand: Modified independence Stand to sit: Modified independence  GAIT: Gait pattern: step through pattern, decreased arm swing- Right, decreased arm swing- Left, trendelenburg, and wide BOS Distance walked: 250 ft Assistive device utilized: None Level of assistance: Modified independence Comments: Increased lateral trunk lean/Trendelenburg pattern with increased gait distance  FUNCTIONAL TESTs:  5 times sit to stand: 20.04 sec Timed up and go (TUG): 22.65 2 minute walk test: 250 ft no device Dynamic Gait Index: NT Gait velocity:  19.85 sec = 1.65 ft/sec *2MWT norms for 40-79 age group:  84 ft  TODAY'S TREATMENT:  Initiated HEP-see below   PATIENT EDUCATION: Education details: PT eval results, POC, HEP initiated Person educated: Patient and Spouse Education method: Explanation, Demonstration, and Handouts Education comprehension: verbalized understanding and returned demonstration   HOME EXERCISE PROGRAM: Access Code: [email or web address] URL: [email or web address] Date: 09/03/2022 Prepared by: [facility]    GOALS: Goals reviewed with patient? Yes  SHORT TERM GOALS: Target date= LTGs  LONG TERM GOALS: Target date: 10/05/2022  Pt will be supervision with HEP for improved strength, balance, gait. Baseline:  Goal status: IN PROGRESS  2.  Pt will improve 5x sit<>stand to less than or equal to 13 sec to demonstrate improved functional strength and transfer efficiency. Baseline: 20.04 sec>12.59 sec 10/04/2022 Goal status: GOAL MET  3.  Pt will improve TUG score to less than or equal to 15 sec for decreased fall risk. Baseline: 22.65 sec; 19.65 sec avg 10/04/2022 Goal status: GOAL PARTIALLY MET  4.  Pt will improve gait velocity to at least 2 ft/sec for improved gait efficiency and safety. Baseline: 1.65 ft/sec>2.45 ft/sec  10/04/2022 Goal status: GOAL MET  5.  Pt will ambulate at least 400 ft in 2 MWT for improved gait effiency and safety with his community walks. Baseline: 250 ft in 2 MWT (normal values = 517 ft)>310 ft in 2 MWT 10/04/2022 Goal status: GOAL PARTIALLY MET  UPDATED 03/49/1791 Recert: LONG TERM GOALS: Target date: 11/16/2022   Pt will be supervision with HEP for improved strength, balance, transfers, and gait. Baseline:  Goal status: GOAL MET, per son report  2.  Pt will improve 5x sit<>stand to less than or equal to 11.5 sec to demonstrate improved functional strength and transfer efficiency. Baseline: 12.59 sec at best 10/04/2022; >30 sec 11/12/2022 Goal status: GOAL NOT MET  3.  Pt will improve TUG score to less than or equal to 15 sec for decreased fall risk. Baseline: 19.65 sec 10/04/2022; 25.09 sec 11/12/2022 Goal status: GOAL NOT MET  4.  Pt will ambulate at least 400 ft in 2 MWT for improved gait effiency and safety with his community walks. Baseline: 310 ft 10/04/2022, 240 ft 11/12/2022 Goal status: GOAL NOT MET  5.  Pt/family will verbalize options for optimal community fitness upon d/c from PT. Baseline:  Goal status: GOAL MET, 11/12/2022     ASSESSMENT:  CLINICAL IMPRESSION: Assessed LTGs this visit, with pt meeting 2 of 5 LTGs.  Son present  again with patient for PT session today.  Son does report patient is a little sluggish today and he didn't sleep well last night.  With functional mobility measures, pt is slowed with FTSTS, TUG,  and decreased distance with 2MWT than last checked in mid-October.  No overt LOB with measures, but pt needs additional reminders to increase step length and foot clearance more often with gait today.  Discussed progress towards goals, and plans for d/c today.  Discussed that consistency with HEP and walking/aerobic activities, which pt/family already has in place, is going to be to help patient continue to move well.    OBJECTIVE  IMPAIRMENTS Abnormal gait, decreased balance, decreased mobility, difficulty walking, decreased strength, and postural dysfunction.   ACTIVITY LIMITATIONS standing, transfers, and locomotion level  PARTICIPATION LIMITATIONS: shopping and community activity  PERSONAL FACTORS 1-2 comorbidities: See PMH; of note:  Azheimer's.  Pt does appear to have good family support.  are also affecting patient's functional outcome.   REHAB POTENTIAL: Good  CLINICAL DECISION MAKING: Evolving/moderate complexity  EVALUATION COMPLEXITY: Moderate  PLAN: PT FREQUENCY: 1x/wk (reduction in frequency to 1x)  PT DURATION: 6 weeks per recert 96/10/6434  PLANNED INTERVENTIONS: Therapeutic exercises, Therapeutic activity, Neuromuscular re-education, Balance training, Gait training, Patient/Family education, Self Care, Vestibular training, and Canalith repositioning  PLAN FOR NEXT SESSION:   D/C PT this visit.  Frazier Butt., PT 11/12/2022, 1:03 PM  [facility] at [facility] [place], [place], [place] [address] Phone # [phone] Fax # [phone]

## 2022-11-28 ENCOUNTER — Encounter: Payer: Self-pay | Admitting: Neurology

## 2022-12-20 ENCOUNTER — Other Ambulatory Visit: Payer: Self-pay | Admitting: Neurology

## 2023-01-01 ENCOUNTER — Ambulatory Visit: Payer: Medicare PPO | Admitting: Neurology

## 2023-01-27 ENCOUNTER — Other Ambulatory Visit: Payer: Self-pay | Admitting: Neurology

## 2023-01-27 DIAGNOSIS — F0280 Dementia in other diseases classified elsewhere without behavioral disturbance: Secondary | ICD-10-CM

## 2023-02-07 DIAGNOSIS — G309 Alzheimer's disease, unspecified: Secondary | ICD-10-CM | POA: Diagnosis not present

## 2023-02-07 DIAGNOSIS — F0280 Dementia in other diseases classified elsewhere without behavioral disturbance: Secondary | ICD-10-CM | POA: Diagnosis not present

## 2023-02-07 DIAGNOSIS — K4090 Unilateral inguinal hernia, without obstruction or gangrene, not specified as recurrent: Secondary | ICD-10-CM | POA: Diagnosis not present

## 2023-02-25 DIAGNOSIS — F0280 Dementia in other diseases classified elsewhere without behavioral disturbance: Secondary | ICD-10-CM | POA: Diagnosis not present

## 2023-02-25 DIAGNOSIS — K4090 Unilateral inguinal hernia, without obstruction or gangrene, not specified as recurrent: Secondary | ICD-10-CM | POA: Diagnosis not present

## 2023-02-25 DIAGNOSIS — G309 Alzheimer's disease, unspecified: Secondary | ICD-10-CM | POA: Diagnosis not present

## 2023-03-26 ENCOUNTER — Telehealth: Payer: Self-pay | Admitting: Neurology

## 2023-03-26 NOTE — Telephone Encounter (Signed)
Line busy at 2:54pm 03/26/2023

## 2023-03-26 NOTE — Telephone Encounter (Signed)
Patient wife called and wanted to know if we could please get a RX for a in home help for patient please call patient wife

## 2023-03-27 NOTE — Telephone Encounter (Signed)
Happy to send to provider for referral for home health for physical therapy,Needs note to suppport it, patient is going to see what is available for an appt or reach out to PCP

## 2023-04-01 ENCOUNTER — Ambulatory Visit: Payer: Medicare PPO | Admitting: Neurology

## 2023-04-01 DIAGNOSIS — N1831 Chronic kidney disease, stage 3a: Secondary | ICD-10-CM | POA: Diagnosis not present

## 2023-04-01 DIAGNOSIS — F0280 Dementia in other diseases classified elsewhere without behavioral disturbance: Secondary | ICD-10-CM | POA: Diagnosis not present

## 2023-04-01 DIAGNOSIS — K4090 Unilateral inguinal hernia, without obstruction or gangrene, not specified as recurrent: Secondary | ICD-10-CM | POA: Diagnosis not present

## 2023-04-01 DIAGNOSIS — N448 Other noninflammatory disorders of the testis: Secondary | ICD-10-CM | POA: Diagnosis not present

## 2023-04-01 DIAGNOSIS — D693 Immune thrombocytopenic purpura: Secondary | ICD-10-CM | POA: Diagnosis not present

## 2023-04-01 DIAGNOSIS — R5381 Other malaise: Secondary | ICD-10-CM | POA: Diagnosis not present

## 2023-04-01 DIAGNOSIS — M5416 Radiculopathy, lumbar region: Secondary | ICD-10-CM | POA: Diagnosis not present

## 2023-04-01 DIAGNOSIS — G309 Alzheimer's disease, unspecified: Secondary | ICD-10-CM | POA: Diagnosis not present

## 2023-04-01 DIAGNOSIS — E669 Obesity, unspecified: Secondary | ICD-10-CM | POA: Diagnosis not present

## 2023-04-03 DIAGNOSIS — F0280 Dementia in other diseases classified elsewhere without behavioral disturbance: Secondary | ICD-10-CM | POA: Diagnosis not present

## 2023-04-03 DIAGNOSIS — E669 Obesity, unspecified: Secondary | ICD-10-CM | POA: Diagnosis not present

## 2023-04-03 DIAGNOSIS — N448 Other noninflammatory disorders of the testis: Secondary | ICD-10-CM | POA: Diagnosis not present

## 2023-04-03 DIAGNOSIS — D693 Immune thrombocytopenic purpura: Secondary | ICD-10-CM | POA: Diagnosis not present

## 2023-04-03 DIAGNOSIS — M5416 Radiculopathy, lumbar region: Secondary | ICD-10-CM | POA: Diagnosis not present

## 2023-04-03 DIAGNOSIS — N1831 Chronic kidney disease, stage 3a: Secondary | ICD-10-CM | POA: Diagnosis not present

## 2023-04-03 DIAGNOSIS — G309 Alzheimer's disease, unspecified: Secondary | ICD-10-CM | POA: Diagnosis not present

## 2023-04-03 DIAGNOSIS — R5381 Other malaise: Secondary | ICD-10-CM | POA: Diagnosis not present

## 2023-04-03 DIAGNOSIS — K4090 Unilateral inguinal hernia, without obstruction or gangrene, not specified as recurrent: Secondary | ICD-10-CM | POA: Diagnosis not present

## 2023-04-10 DIAGNOSIS — N448 Other noninflammatory disorders of the testis: Secondary | ICD-10-CM | POA: Diagnosis not present

## 2023-04-10 DIAGNOSIS — N1831 Chronic kidney disease, stage 3a: Secondary | ICD-10-CM | POA: Diagnosis not present

## 2023-04-10 DIAGNOSIS — E669 Obesity, unspecified: Secondary | ICD-10-CM | POA: Diagnosis not present

## 2023-04-10 DIAGNOSIS — F0280 Dementia in other diseases classified elsewhere without behavioral disturbance: Secondary | ICD-10-CM | POA: Diagnosis not present

## 2023-04-10 DIAGNOSIS — G309 Alzheimer's disease, unspecified: Secondary | ICD-10-CM | POA: Diagnosis not present

## 2023-04-10 DIAGNOSIS — D693 Immune thrombocytopenic purpura: Secondary | ICD-10-CM | POA: Diagnosis not present

## 2023-04-10 DIAGNOSIS — R5381 Other malaise: Secondary | ICD-10-CM | POA: Diagnosis not present

## 2023-04-10 DIAGNOSIS — M5416 Radiculopathy, lumbar region: Secondary | ICD-10-CM | POA: Diagnosis not present

## 2023-04-10 DIAGNOSIS — K4090 Unilateral inguinal hernia, without obstruction or gangrene, not specified as recurrent: Secondary | ICD-10-CM | POA: Diagnosis not present

## 2023-04-11 DIAGNOSIS — F05 Delirium due to known physiological condition: Secondary | ICD-10-CM | POA: Diagnosis not present

## 2023-04-11 DIAGNOSIS — N1831 Chronic kidney disease, stage 3a: Secondary | ICD-10-CM | POA: Diagnosis not present

## 2023-04-11 DIAGNOSIS — G309 Alzheimer's disease, unspecified: Secondary | ICD-10-CM | POA: Diagnosis not present

## 2023-04-11 DIAGNOSIS — F0280 Dementia in other diseases classified elsewhere without behavioral disturbance: Secondary | ICD-10-CM | POA: Diagnosis not present

## 2023-04-11 DIAGNOSIS — D696 Thrombocytopenia, unspecified: Secondary | ICD-10-CM | POA: Diagnosis not present

## 2023-04-11 DIAGNOSIS — R5381 Other malaise: Secondary | ICD-10-CM | POA: Diagnosis not present

## 2023-04-19 DIAGNOSIS — R5381 Other malaise: Secondary | ICD-10-CM | POA: Diagnosis not present

## 2023-04-19 DIAGNOSIS — F0280 Dementia in other diseases classified elsewhere without behavioral disturbance: Secondary | ICD-10-CM | POA: Diagnosis not present

## 2023-04-19 DIAGNOSIS — D693 Immune thrombocytopenic purpura: Secondary | ICD-10-CM | POA: Diagnosis not present

## 2023-04-19 DIAGNOSIS — L0390 Cellulitis, unspecified: Secondary | ICD-10-CM | POA: Diagnosis not present

## 2023-04-19 DIAGNOSIS — E669 Obesity, unspecified: Secondary | ICD-10-CM | POA: Diagnosis not present

## 2023-04-19 DIAGNOSIS — G309 Alzheimer's disease, unspecified: Secondary | ICD-10-CM | POA: Diagnosis not present

## 2023-04-19 DIAGNOSIS — R600 Localized edema: Secondary | ICD-10-CM | POA: Diagnosis not present

## 2023-04-19 DIAGNOSIS — N448 Other noninflammatory disorders of the testis: Secondary | ICD-10-CM | POA: Diagnosis not present

## 2023-04-19 DIAGNOSIS — N1831 Chronic kidney disease, stage 3a: Secondary | ICD-10-CM | POA: Diagnosis not present

## 2023-04-19 DIAGNOSIS — K4090 Unilateral inguinal hernia, without obstruction or gangrene, not specified as recurrent: Secondary | ICD-10-CM | POA: Diagnosis not present

## 2023-04-19 DIAGNOSIS — M5416 Radiculopathy, lumbar region: Secondary | ICD-10-CM | POA: Diagnosis not present
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
## 2023-04-22 ENCOUNTER — Ambulatory Visit: Payer: Medicare PPO | Admitting: Neurology

## 2023-04-22 ENCOUNTER — Encounter: Payer: Self-pay | Admitting: Neurology

## 2023-04-22 VITALS — BP 90/58 | HR 68 | Ht 70.0 in | Wt 207.2 lb

## 2023-04-22 DIAGNOSIS — K4090 Unilateral inguinal hernia, without obstruction or gangrene, not specified as recurrent: Secondary | ICD-10-CM | POA: Diagnosis not present

## 2023-04-22 DIAGNOSIS — M5416 Radiculopathy, lumbar region: Secondary | ICD-10-CM | POA: Diagnosis not present

## 2023-04-22 DIAGNOSIS — G301 Alzheimer's disease with late onset: Secondary | ICD-10-CM

## 2023-04-22 DIAGNOSIS — F0280 Dementia in other diseases classified elsewhere without behavioral disturbance: Secondary | ICD-10-CM

## 2023-04-22 DIAGNOSIS — G309 Alzheimer's disease, unspecified: Secondary | ICD-10-CM | POA: Diagnosis not present

## 2023-04-22 DIAGNOSIS — N1831 Chronic kidney disease, stage 3a: Secondary | ICD-10-CM | POA: Diagnosis not present

## 2023-04-22 DIAGNOSIS — N448 Other noninflammatory disorders of the testis: Secondary | ICD-10-CM | POA: Diagnosis not present

## 2023-04-22 DIAGNOSIS — D693 Immune thrombocytopenic purpura: Secondary | ICD-10-CM | POA: Diagnosis not present

## 2023-04-22 DIAGNOSIS — R5381 Other malaise: Secondary | ICD-10-CM | POA: Diagnosis not present

## 2023-04-22 DIAGNOSIS — E669 Obesity, unspecified: Secondary | ICD-10-CM | POA: Diagnosis not present

## 2023-04-22 MED ORDER — MEMANTINE HCL 10 MG PO TABS
10.0000 mg | ORAL_TABLET | Freq: Two times a day (BID) | ORAL | 3 refills | Status: DC
Start: 2023-04-22 — End: 2024-01-24

## 2023-04-22 MED ORDER — DONEPEZIL HCL 10 MG PO TABS: ORAL_TABLET | ORAL | 3 refills | Status: DC

## 2023-04-22 NOTE — Patient Instructions (Addendum)
Donepezil and memantine Continue physical and occupational therapy 24 hour supervision No driving Follow up 8 months

## 2023-04-23 DIAGNOSIS — R5381 Other malaise: Secondary | ICD-10-CM | POA: Diagnosis not present

## 2023-04-23 DIAGNOSIS — D693 Immune thrombocytopenic purpura: Secondary | ICD-10-CM | POA: Diagnosis not present

## 2023-04-23 DIAGNOSIS — F0280 Dementia in other diseases classified elsewhere without behavioral disturbance: Secondary | ICD-10-CM | POA: Diagnosis not present

## 2023-04-23 DIAGNOSIS — K4090 Unilateral inguinal hernia, without obstruction or gangrene, not specified as recurrent: Secondary | ICD-10-CM | POA: Diagnosis not present

## 2023-04-23 DIAGNOSIS — N448 Other noninflammatory disorders of the testis: Secondary | ICD-10-CM | POA: Diagnosis not present

## 2023-04-23 DIAGNOSIS — N1831 Chronic kidney disease, stage 3a: Secondary | ICD-10-CM | POA: Diagnosis not present

## 2023-04-23 DIAGNOSIS — E669 Obesity, unspecified: Secondary | ICD-10-CM | POA: Diagnosis not present

## 2023-04-23 DIAGNOSIS — G309 Alzheimer's disease, unspecified: Secondary | ICD-10-CM | POA: Diagnosis not present

## 2023-04-23 DIAGNOSIS — M5416 Radiculopathy, lumbar region: Secondary | ICD-10-CM | POA: Diagnosis not present

## 2023-04-25 DIAGNOSIS — E1151 Type 2 diabetes mellitus with diabetic peripheral angiopathy without gangrene: Secondary | ICD-10-CM | POA: Diagnosis not present

## 2023-04-25 DIAGNOSIS — B351 Tinea unguium: Secondary | ICD-10-CM | POA: Diagnosis not present

## 2023-04-26 ENCOUNTER — Other Ambulatory Visit (HOSPITAL_COMMUNITY): Payer: Self-pay | Admitting: Physician Assistant

## 2023-04-26 ENCOUNTER — Ambulatory Visit (HOSPITAL_COMMUNITY)
Admission: RE | Admit: 2023-04-26 | Discharge: 2023-04-26 | Disposition: A | Discharge status: Home / Self Care | Payer: Medicare PPO | Source: Ambulatory Visit | Attending: Physician Assistant | Admitting: Physician Assistant

## 2023-04-26 DIAGNOSIS — M7989 Other specified soft tissue disorders: Secondary | ICD-10-CM

## 2023-04-26 NOTE — Progress Notes (Signed)
Right lower extremity venous duplex has been completed. Preliminary results can be found in CV Proc through chart review.  Results were faxed to [facility].  04/26/23 3:50 PM Olen Cordial RVT

## 2023-04-29 DIAGNOSIS — M5416 Radiculopathy, lumbar region: Secondary | ICD-10-CM | POA: Diagnosis not present

## 2023-04-29 DIAGNOSIS — K4090 Unilateral inguinal hernia, without obstruction or gangrene, not specified as recurrent: Secondary | ICD-10-CM | POA: Diagnosis not present

## 2023-04-29 DIAGNOSIS — N1831 Chronic kidney disease, stage 3a: Secondary | ICD-10-CM | POA: Diagnosis not present

## 2023-04-29 DIAGNOSIS — E669 Obesity, unspecified: Secondary | ICD-10-CM | POA: Diagnosis not present

## 2023-04-29 DIAGNOSIS — N448 Other noninflammatory disorders of the testis: Secondary | ICD-10-CM | POA: Diagnosis not present

## 2023-04-29 DIAGNOSIS — F0280 Dementia in other diseases classified elsewhere without behavioral disturbance: Secondary | ICD-10-CM | POA: Diagnosis not present

## 2023-04-29 DIAGNOSIS — G309 Alzheimer's disease, unspecified: Secondary | ICD-10-CM | POA: Diagnosis not present

## 2023-04-29 DIAGNOSIS — D693 Immune thrombocytopenic purpura: Secondary | ICD-10-CM | POA: Diagnosis not present

## 2023-04-29 DIAGNOSIS — R5381 Other malaise: Secondary | ICD-10-CM | POA: Diagnosis not present

## 2023-04-30 DIAGNOSIS — E669 Obesity, unspecified: Secondary | ICD-10-CM | POA: Diagnosis not present

## 2023-04-30 DIAGNOSIS — D693 Immune thrombocytopenic purpura: Secondary | ICD-10-CM | POA: Diagnosis not present

## 2023-04-30 DIAGNOSIS — F0280 Dementia in other diseases classified elsewhere without behavioral disturbance: Secondary | ICD-10-CM | POA: Diagnosis not present

## 2023-04-30 DIAGNOSIS — R5381 Other malaise: Secondary | ICD-10-CM | POA: Diagnosis not present

## 2023-04-30 DIAGNOSIS — N1831 Chronic kidney disease, stage 3a: Secondary | ICD-10-CM | POA: Diagnosis not present

## 2023-04-30 DIAGNOSIS — G309 Alzheimer's disease, unspecified: Secondary | ICD-10-CM | POA: Diagnosis not present

## 2023-04-30 DIAGNOSIS — N448 Other noninflammatory disorders of the testis: Secondary | ICD-10-CM | POA: Diagnosis not present

## 2023-04-30 DIAGNOSIS — M5416 Radiculopathy, lumbar region: Secondary | ICD-10-CM | POA: Diagnosis not present

## 2023-04-30 DIAGNOSIS — K4090 Unilateral inguinal hernia, without obstruction or gangrene, not specified as recurrent: Secondary | ICD-10-CM | POA: Diagnosis not present

## 2023-05-16 DIAGNOSIS — N448 Other noninflammatory disorders of the testis: Secondary | ICD-10-CM | POA: Diagnosis not present

## 2023-05-16 DIAGNOSIS — F0280 Dementia in other diseases classified elsewhere without behavioral disturbance: Secondary | ICD-10-CM | POA: Diagnosis not present

## 2023-05-16 DIAGNOSIS — G309 Alzheimer's disease, unspecified: Secondary | ICD-10-CM | POA: Diagnosis not present

## 2023-05-16 DIAGNOSIS — N1831 Chronic kidney disease, stage 3a: Secondary | ICD-10-CM | POA: Diagnosis not present

## 2023-05-16 DIAGNOSIS — E669 Obesity, unspecified: Secondary | ICD-10-CM | POA: Diagnosis not present

## 2023-05-16 DIAGNOSIS — M5416 Radiculopathy, lumbar region: Secondary | ICD-10-CM | POA: Diagnosis not present

## 2023-05-16 DIAGNOSIS — D693 Immune thrombocytopenic purpura: Secondary | ICD-10-CM | POA: Diagnosis not present

## 2023-05-16 DIAGNOSIS — K4090 Unilateral inguinal hernia, without obstruction or gangrene, not specified as recurrent: Secondary | ICD-10-CM | POA: Diagnosis not present

## 2023-05-16 DIAGNOSIS — R5381 Other malaise: Secondary | ICD-10-CM | POA: Diagnosis not present

## 2023-05-17 DIAGNOSIS — G309 Alzheimer's disease, unspecified: Secondary | ICD-10-CM | POA: Diagnosis not present

## 2023-05-17 DIAGNOSIS — F0280 Dementia in other diseases classified elsewhere without behavioral disturbance: Secondary | ICD-10-CM | POA: Diagnosis not present

## 2023-05-17 DIAGNOSIS — D693 Immune thrombocytopenic purpura: Secondary | ICD-10-CM | POA: Diagnosis not present

## 2023-05-17 DIAGNOSIS — M5416 Radiculopathy, lumbar region: Secondary | ICD-10-CM | POA: Diagnosis not present

## 2023-05-17 DIAGNOSIS — K4090 Unilateral inguinal hernia, without obstruction or gangrene, not specified as recurrent: Secondary | ICD-10-CM | POA: Diagnosis not present

## 2023-05-17 DIAGNOSIS — R5381 Other malaise: Secondary | ICD-10-CM | POA: Diagnosis not present

## 2023-05-17 DIAGNOSIS — N1831 Chronic kidney disease, stage 3a: Secondary | ICD-10-CM | POA: Diagnosis not present

## 2023-05-17 DIAGNOSIS — N448 Other noninflammatory disorders of the testis: Secondary | ICD-10-CM | POA: Diagnosis not present

## 2023-05-17 DIAGNOSIS — E669 Obesity, unspecified: Secondary | ICD-10-CM | POA: Diagnosis not present

## 2023-05-29 DIAGNOSIS — N448 Other noninflammatory disorders of the testis: Secondary | ICD-10-CM | POA: Diagnosis not present

## 2023-05-29 DIAGNOSIS — N1831 Chronic kidney disease, stage 3a: Secondary | ICD-10-CM | POA: Diagnosis not present

## 2023-05-29 DIAGNOSIS — G309 Alzheimer's disease, unspecified: Secondary | ICD-10-CM | POA: Diagnosis not present

## 2023-05-29 DIAGNOSIS — D693 Immune thrombocytopenic purpura: Secondary | ICD-10-CM | POA: Diagnosis not present

## 2023-05-29 DIAGNOSIS — R5381 Other malaise: Secondary | ICD-10-CM | POA: Diagnosis not present

## 2023-05-29 DIAGNOSIS — E669 Obesity, unspecified: Secondary | ICD-10-CM | POA: Diagnosis not present

## 2023-05-29 DIAGNOSIS — M5416 Radiculopathy, lumbar region: Secondary | ICD-10-CM | POA: Diagnosis not present

## 2023-05-29 DIAGNOSIS — F0280 Dementia in other diseases classified elsewhere without behavioral disturbance: Secondary | ICD-10-CM | POA: Diagnosis not present

## 2023-05-29 DIAGNOSIS — K4090 Unilateral inguinal hernia, without obstruction or gangrene, not specified as recurrent: Secondary | ICD-10-CM | POA: Diagnosis not present

## 2023-06-07 ENCOUNTER — Telehealth: Payer: Self-pay | Admitting: Neurology

## 2023-06-07 NOTE — Telephone Encounter (Signed)
Patient is out of lorazepam, but Patient isnt sure which doctor wrote the original prescription

## 2023-06-07 NOTE — Telephone Encounter (Signed)
Per Care Everywhere it looks like [place] [facility] is send a new script.  Dr. I do not see a note in our system stating Dr.Jaffe wrote for the medication.

## 2023-07-18 DIAGNOSIS — M5416 Radiculopathy, lumbar region: Secondary | ICD-10-CM | POA: Diagnosis not present

## 2023-07-18 DIAGNOSIS — L821 Other seborrheic keratosis: Secondary | ICD-10-CM | POA: Diagnosis not present

## 2023-07-18 DIAGNOSIS — K4090 Unilateral inguinal hernia, without obstruction or gangrene, not specified as recurrent: Secondary | ICD-10-CM | POA: Diagnosis not present

## 2023-07-18 DIAGNOSIS — G309 Alzheimer's disease, unspecified: Secondary | ICD-10-CM | POA: Diagnosis not present

## 2023-07-18 DIAGNOSIS — L570 Actinic keratosis: Secondary | ICD-10-CM | POA: Diagnosis not present

## 2023-07-18 DIAGNOSIS — F0280 Dementia in other diseases classified elsewhere without behavioral disturbance: Secondary | ICD-10-CM | POA: Diagnosis not present

## 2023-07-18 DIAGNOSIS — M7989 Other specified soft tissue disorders: Secondary | ICD-10-CM | POA: Diagnosis not present

## 2023-07-18 DIAGNOSIS — Z87891 Personal history of nicotine dependence: Secondary | ICD-10-CM | POA: Diagnosis not present

## 2023-07-18 DIAGNOSIS — D693 Immune thrombocytopenic purpura: Secondary | ICD-10-CM | POA: Diagnosis not present

## 2023-07-20 ENCOUNTER — Encounter (HOSPITAL_COMMUNITY): Payer: Self-pay

## 2023-07-20 ENCOUNTER — Emergency Department (HOSPITAL_COMMUNITY): Payer: Medicare PPO

## 2023-07-20 ENCOUNTER — Other Ambulatory Visit: Payer: Self-pay

## 2023-07-20 ENCOUNTER — Emergency Department (HOSPITAL_COMMUNITY)
Admission: EM | Admit: 2023-07-20 | Discharge: 2023-07-20 | Disposition: A | Discharge status: Home / Self Care | Payer: Medicare PPO | Attending: Emergency Medicine | Admitting: Emergency Medicine

## 2023-07-20 DIAGNOSIS — F0390 Unspecified dementia without behavioral disturbance: Secondary | ICD-10-CM | POA: Insufficient documentation

## 2023-07-20 DIAGNOSIS — G319 Degenerative disease of nervous system, unspecified: Secondary | ICD-10-CM | POA: Diagnosis not present

## 2023-07-20 DIAGNOSIS — R42 Dizziness and giddiness: Secondary | ICD-10-CM | POA: Diagnosis not present

## 2023-07-20 DIAGNOSIS — Z85820 Personal history of malignant melanoma of skin: Secondary | ICD-10-CM | POA: Insufficient documentation

## 2023-07-20 DIAGNOSIS — R55 Syncope and collapse: Secondary | ICD-10-CM | POA: Insufficient documentation

## 2023-07-20 DIAGNOSIS — G309 Alzheimer's disease, unspecified: Secondary | ICD-10-CM | POA: Insufficient documentation

## 2023-07-20 DIAGNOSIS — E860 Dehydration: Secondary | ICD-10-CM | POA: Insufficient documentation

## 2023-07-20 DIAGNOSIS — R410 Disorientation, unspecified: Secondary | ICD-10-CM | POA: Diagnosis not present

## 2023-07-20 DIAGNOSIS — J329 Chronic sinusitis, unspecified: Secondary | ICD-10-CM | POA: Insufficient documentation

## 2023-07-20 DIAGNOSIS — K5900 Constipation, unspecified: Secondary | ICD-10-CM | POA: Diagnosis not present

## 2023-07-20 DIAGNOSIS — R4182 Altered mental status, unspecified: Secondary | ICD-10-CM | POA: Diagnosis not present

## 2023-07-20 DIAGNOSIS — F0280 Dementia in other diseases classified elsewhere without behavioral disturbance: Secondary | ICD-10-CM | POA: Diagnosis not present

## 2023-07-20 HISTORY — DX: Unspecified dementia, unspecified severity, without behavioral disturbance, psychotic disturbance, mood disturbance, and anxiety: F03.90

## 2023-07-20 HISTORY — DX: Disorder of kidney and ureter, unspecified: N28.9

## 2023-07-20 LAB — COMPREHENSIVE METABOLIC PANEL
ALT: 26 U/L (ref 0–44)
AST: 22 U/L (ref 15–41)
Albumin: 3.3 g/dL — ABNORMAL LOW (ref 3.5–5.0)
Alkaline Phosphatase: 59 U/L (ref 38–126)
Anion gap: 11 (ref 5–15)
BUN: 14 mg/dL (ref 8–23)
CO2: 23 mmol/L (ref 22–32)
Calcium: 8.6 mg/dL — ABNORMAL LOW (ref 8.9–10.3)
Chloride: 106 mmol/L (ref 98–111)
Creatinine, Ser: 1.12 mg/dL (ref 0.61–1.24)
GFR, Estimated: >60 mL/min (ref >60)
Glucose, Bld: 99 mg/dL (ref 70–99)
Potassium: 3.4 mmol/L — ABNORMAL LOW (ref 3.5–5.1)
Sodium: 140 mmol/L (ref 135–145)
Total Bilirubin: 0.3 mg/dL (ref 0.3–1.2)
Total Protein: 7.3 g/dL (ref 6.5–8.1)

## 2023-07-20 LAB — I-STAT CHEM 8, ED
BUN: 14 mg/dL (ref 8–23)
Calcium, Ion: 1.16 mmol/L (ref 1.15–1.40)
Chloride: 106 mmol/L (ref 98–111)
Creatinine, Ser: 1.5 mg/dL — ABNORMAL HIGH (ref 0.61–1.24)
Glucose, Bld: 96 mg/dL (ref 70–99)
HCT: 47 % (ref 39.0–52.0)
Hemoglobin: 16 g/dL (ref 13.0–17.0)
Potassium: 3.9 mmol/L (ref 3.5–5.1)
Sodium: 144 mmol/L (ref 135–145)
TCO2: 23 mmol/L (ref 22–32)

## 2023-07-20 LAB — CBC
HCT: 46.7 % (ref 39.0–52.0)
Hemoglobin: 15.3 g/dL (ref 13.0–17.0)
MCH: 31.1 pg (ref 26.0–34.0)
MCHC: 32.8 g/dL (ref 30.0–36.0)
MCV: 94.9 fL (ref 80.0–100.0)
Platelets: 148 10*3/uL — ABNORMAL LOW (ref 150–400)
RBC: 4.92 MIL/uL (ref 4.22–5.81)
RDW: 13.1 % (ref 11.5–15.5)
WBC: 6.5 10*3/uL (ref 4.0–10.5)
nRBC: 0 % (ref 0.0–0.2)

## 2023-07-20 LAB — DIFFERENTIAL
Abs Immature Granulocytes: 0.01 10*3/uL (ref 0.00–0.07)
Basophils Absolute: 0 10*3/uL (ref 0.0–0.1)
Basophils Relative: 1 %
Eosinophils Absolute: 0.1 10*3/uL (ref 0.0–0.5)
Eosinophils Relative: 2 %
Immature Granulocytes: 0 %
Lymphocytes Relative: 15 %
Lymphs Abs: 1 10*3/uL (ref 0.7–4.0)
Monocytes Absolute: 0.4 10*3/uL (ref 0.1–1.0)
Monocytes Relative: 6 %
Neutro Abs: 5 10*3/uL (ref 1.7–7.7)
Neutrophils Relative %: 76 %

## 2023-07-20 LAB — APTT: aPTT: 27 seconds (ref 24–36)

## 2023-07-20 LAB — TROPONIN I (HIGH SENSITIVITY)
Troponin I (High Sensitivity): 7 ng/L (ref <18)
Troponin I (High Sensitivity): 12 ng/L (ref <18)

## 2023-07-20 LAB — ETHANOL: Alcohol, Ethyl (B): <10 mg/dL (ref <10)

## 2023-07-20 MED ORDER — SODIUM CHLORIDE 0.9 % IV BOLUS
1000.0000 mL | Freq: Once | INTRAVENOUS | Status: AC
  Administered 2023-07-20: 1000 mL via INTRAVENOUS

## 2023-07-20 NOTE — Discharge Instructions (Signed)
Your symptoms likely from dementia and mild dehydration.  As we discussed your heart function and kidney function were normal.  Your MRI did not show a stroke.  Please stay hydrated  See your doctor for follow-up  Return to ER if he has lethargy, passing out

## 2023-07-20 NOTE — ED Notes (Signed)
Pt family stated he does not drink a lot of fluids. Pt normally takes small sips at home. Nurse attempted to administer swallow test and pt did not drink entire 3oz. Pt fail swallow test, pt took smalls sips. MD notified.

## 2023-07-20 NOTE — ED Provider Notes (Signed)
[place] EMERGENCY DEPARTMENT AT [facility] Provider Note   CSN: [id number] Arrival date & time: 07/20/23  2016     History  Chief Complaint  Patient presents with   Near Syncope    AMED DATTA is a 77 y.o. male history of dementia, melanoma, here presenting with altered mental status.  Patient did not eat dinner today.  Patient was in the shower and son noticed that he became altered.  He tries to lower him down and apparently patient had some tremors at that time.  His eyes rolled back.  Son called EMS and by the time EMS got here, patient is back to baseline.  Patient follows up with neurology and never had a stroke before.  Denies any history of seizures.  On arrival, it is unclear if he has a left facial droop or not.  Denies any chest pain or shortness of breath.  Patient had no recollection of the event.  The history is provided by a relative and the spouse.       Home Medications Prior to Admission medications   Medication Sig Start Date End Date Taking? Authorizing Provider  donepezil (ARICEPT) 10 MG tablet TAKE 1 TABLET(10 MG) BY MOUTH AT BEDTIME 04/22/23   Jaffe, Adam R, DO  LORazepam (ATIVAN) 0.5 MG tablet Take 0.5 mg by mouth at bedtime as needed. 04/11/23   [provider]  memantine (NAMENDA) 10 MG tablet Take 1 tablet (10 mg total) by mouth 2 (two) times daily. 04/22/23   Drema Dallas, DO  Omega-3 Fatty Acids (FISH OIL) 1000 MG CAPS Take by mouth.    [provider]  pantoprazole (PROTONIX) 40 MG tablet TAKE 1 TABLET BY MOUTH EVERY DAY 30 MINUTES BEFORE DINNER 06/11/22   [provider]  sulfamethoxazole-trimethoprim (BACTRIM DS) 800-160 MG tablet Take 1 tablet by mouth 2 (two) times daily. 04/20/23   [provider]      Allergies    Penicillins    Review of Systems   Review of Systems  Psychiatric/Behavioral:  Positive for confusion.   All other systems reviewed and are negative.   Physical Exam Updated Vital  Signs BP 114/75 (BP Location: Right Arm)   Pulse 76   Temp 98.5 F (36.9 C) (Oral)   Resp 20   SpO2 93%  Physical Exam Vitals and nursing note reviewed.  Constitutional:      Comments: Chronically ill and demented  HENT:     Head: Normocephalic.     Nose: Nose normal.     Mouth/Throat:     Mouth: Mucous membranes are moist.  Eyes:     Extraocular Movements: Extraocular movements intact.     Pupils: Pupils are equal, round, and reactive to light.  Cardiovascular:     Rate and Rhythm: Normal rate and regular rhythm.     Pulses: Normal pulses.     Heart sounds: Normal heart sounds.  Pulmonary:     Effort: Pulmonary effort is normal.     Breath sounds: Normal breath sounds.  Abdominal:     General: Abdomen is flat.     Palpations: Abdomen is soft.  Musculoskeletal:        General: Normal range of motion.     Cervical back: Normal range of motion.  Skin:    General: Skin is warm.  Neurological:     Comments: ?  Left facial droop.  No obvious slurred speech.  Patient's strength is 4 out of 5 left leg  and 5 out of 5 bilateral arms and right leg  Psychiatric:        Mood and Affect: Mood normal.        Behavior: Behavior normal.     ED Results / Procedures / Treatments   Labs (all labs ordered are listed, but only abnormal results are displayed) Labs Reviewed  APTT  CBC  DIFFERENTIAL  COMPREHENSIVE METABOLIC PANEL  ETHANOL  RAPID URINE DRUG SCREEN, HOSP PERFORMED  URINALYSIS, ROUTINE W REFLEX MICROSCOPIC  I-STAT CHEM 8, ED  TROPONIN I (HIGH SENSITIVITY)    EKG None  Radiology No results found.  Procedures Procedures    Medications Ordered in ED Medications - No data to display  ED Course/ Medical Decision Making/ A&P                                 Medical Decision Making DONTERIUS FILLEY is a 77 y.o. male here presenting with altered mental status.  Patient had an acute onset of near syncope.  Consider also seizure versus TIA versus toxic metabolic  syndrome.  Plan to get CBC and CMP and troponin and MRI brain.    11:08 PM I reviewed patient's labs and independently interpreted imaging studies.  Labs are unremarkable.  Troponin negative x 2.  MRI brain showed no stroke.  Patient is back to baseline.  Patient given IV fluids and felt better.  I think symptoms likely related to mild dehydration or dementia  Problems Addressed: Dehydration: acute illness or injury Dementia without behavioral disturbance, psychotic disturbance, mood disturbance, or anxiety, unspecified dementia severity, unspecified dementia type (HCC): acute illness or injury  Amount and/or Complexity of Data Reviewed Labs: ordered. Decision-making details documented in ED Course. Radiology: ordered and independent interpretation performed. Decision-making details documented in ED Course.    Final Clinical Impression(s) / ED Diagnoses Final diagnoses:  None    Rx / DC Orders ED Discharge Orders     None         Charlynne Pander, MD 07/20/23 2310

## 2023-07-20 NOTE — ED Triage Notes (Signed)
Pt arrived via [facility] for Near syncope, pt has hx dementia, worse at night. Pt seems more confused than usually per family. Pt was taking a shower and slowly let him self down. New onset of weakness per EMS. Poor appetite. Some new onset of constipation.

## 2023-07-23 DIAGNOSIS — M7989 Other specified soft tissue disorders: Secondary | ICD-10-CM | POA: Diagnosis not present

## 2023-07-23 DIAGNOSIS — L821 Other seborrheic keratosis: Secondary | ICD-10-CM | POA: Diagnosis not present

## 2023-07-23 DIAGNOSIS — F0280 Dementia in other diseases classified elsewhere without behavioral disturbance: Secondary | ICD-10-CM | POA: Diagnosis not present

## 2023-07-23 DIAGNOSIS — D693 Immune thrombocytopenic purpura: Secondary | ICD-10-CM | POA: Diagnosis not present

## 2023-07-23 DIAGNOSIS — Z87891 Personal history of nicotine dependence: Secondary | ICD-10-CM | POA: Diagnosis not present

## 2023-07-23 DIAGNOSIS — M5416 Radiculopathy, lumbar region: Secondary | ICD-10-CM | POA: Diagnosis not present

## 2023-07-23 DIAGNOSIS — G309 Alzheimer's disease, unspecified: Secondary | ICD-10-CM | POA: Diagnosis not present

## 2023-07-23 DIAGNOSIS — K4090 Unilateral inguinal hernia, without obstruction or gangrene, not specified as recurrent: Secondary | ICD-10-CM | POA: Diagnosis not present

## 2023-07-23 DIAGNOSIS — L570 Actinic keratosis: Secondary | ICD-10-CM | POA: Diagnosis not present

## 2023-07-26 DIAGNOSIS — M21961 Unspecified acquired deformity of right lower leg: Secondary | ICD-10-CM | POA: Diagnosis not present

## 2023-07-26 DIAGNOSIS — M792 Neuralgia and neuritis, unspecified: Secondary | ICD-10-CM | POA: Diagnosis not present

## 2023-07-26 DIAGNOSIS — I739 Peripheral vascular disease, unspecified: Secondary | ICD-10-CM | POA: Diagnosis not present

## 2023-07-26 DIAGNOSIS — B351 Tinea unguium: Secondary | ICD-10-CM | POA: Diagnosis not present

## 2023-07-26 DIAGNOSIS — E1151 Type 2 diabetes mellitus with diabetic peripheral angiopathy without gangrene: Secondary | ICD-10-CM | POA: Diagnosis not present

## 2023-07-26 DIAGNOSIS — M21962 Unspecified acquired deformity of left lower leg: Secondary | ICD-10-CM | POA: Diagnosis not present

## 2023-07-30 DIAGNOSIS — K4090 Unilateral inguinal hernia, without obstruction or gangrene, not specified as recurrent: Secondary | ICD-10-CM | POA: Diagnosis not present

## 2023-07-30 DIAGNOSIS — D693 Immune thrombocytopenic purpura: Secondary | ICD-10-CM | POA: Diagnosis not present

## 2023-07-30 DIAGNOSIS — M7989 Other specified soft tissue disorders: Secondary | ICD-10-CM | POA: Diagnosis not present

## 2023-07-30 DIAGNOSIS — L570 Actinic keratosis: Secondary | ICD-10-CM | POA: Diagnosis not present

## 2023-07-30 DIAGNOSIS — L821 Other seborrheic keratosis: Secondary | ICD-10-CM | POA: Diagnosis not present

## 2023-07-30 DIAGNOSIS — M5416 Radiculopathy, lumbar region: Secondary | ICD-10-CM | POA: Diagnosis not present

## 2023-07-30 DIAGNOSIS — G309 Alzheimer's disease, unspecified: Secondary | ICD-10-CM | POA: Diagnosis not present

## 2023-07-30 DIAGNOSIS — Z87891 Personal history of nicotine dependence: Secondary | ICD-10-CM | POA: Diagnosis not present

## 2023-07-30 DIAGNOSIS — F0280 Dementia in other diseases classified elsewhere without behavioral disturbance: Secondary | ICD-10-CM | POA: Diagnosis not present

## 2023-07-31 DIAGNOSIS — L570 Actinic keratosis: Secondary | ICD-10-CM | POA: Diagnosis not present

## 2023-07-31 DIAGNOSIS — M5416 Radiculopathy, lumbar region: Secondary | ICD-10-CM | POA: Diagnosis not present

## 2023-07-31 DIAGNOSIS — K4090 Unilateral inguinal hernia, without obstruction or gangrene, not specified as recurrent: Secondary | ICD-10-CM | POA: Diagnosis not present

## 2023-07-31 DIAGNOSIS — L821 Other seborrheic keratosis: Secondary | ICD-10-CM | POA: Diagnosis not present

## 2023-07-31 DIAGNOSIS — D693 Immune thrombocytopenic purpura: Secondary | ICD-10-CM | POA: Diagnosis not present

## 2023-07-31 DIAGNOSIS — M7989 Other specified soft tissue disorders: Secondary | ICD-10-CM | POA: Diagnosis not present

## 2023-07-31 DIAGNOSIS — F0280 Dementia in other diseases classified elsewhere without behavioral disturbance: Secondary | ICD-10-CM | POA: Diagnosis not present

## 2023-07-31 DIAGNOSIS — Z87891 Personal history of nicotine dependence: Secondary | ICD-10-CM | POA: Diagnosis not present

## 2023-07-31 DIAGNOSIS — G309 Alzheimer's disease, unspecified: Secondary | ICD-10-CM | POA: Diagnosis not present

## 2023-07-31 DIAGNOSIS — B351 Tinea unguium: Secondary | ICD-10-CM | POA: Diagnosis not present

## 2023-08-05 DIAGNOSIS — F0280 Dementia in other diseases classified elsewhere without behavioral disturbance: Secondary | ICD-10-CM | POA: Diagnosis not present

## 2023-08-05 DIAGNOSIS — L821 Other seborrheic keratosis: Secondary | ICD-10-CM | POA: Diagnosis not present

## 2023-08-05 DIAGNOSIS — D693 Immune thrombocytopenic purpura: Secondary | ICD-10-CM | POA: Diagnosis not present

## 2023-08-05 DIAGNOSIS — R350 Frequency of micturition: Secondary | ICD-10-CM | POA: Diagnosis not present

## 2023-08-05 DIAGNOSIS — G309 Alzheimer's disease, unspecified: Secondary | ICD-10-CM | POA: Diagnosis not present

## 2023-08-05 DIAGNOSIS — R5381 Other malaise: Secondary | ICD-10-CM | POA: Diagnosis not present

## 2023-08-05 DIAGNOSIS — F05 Delirium due to known physiological condition: Secondary | ICD-10-CM | POA: Diagnosis not present

## 2023-08-08 DIAGNOSIS — G309 Alzheimer's disease, unspecified: Secondary | ICD-10-CM | POA: Diagnosis not present

## 2023-08-08 DIAGNOSIS — F0280 Dementia in other diseases classified elsewhere without behavioral disturbance: Secondary | ICD-10-CM | POA: Diagnosis not present

## 2023-08-08 DIAGNOSIS — L570 Actinic keratosis: Secondary | ICD-10-CM | POA: Diagnosis not present

## 2023-08-08 DIAGNOSIS — D693 Immune thrombocytopenic purpura: Secondary | ICD-10-CM | POA: Diagnosis not present

## 2023-08-08 DIAGNOSIS — L821 Other seborrheic keratosis: Secondary | ICD-10-CM | POA: Diagnosis not present

## 2023-08-08 DIAGNOSIS — M5416 Radiculopathy, lumbar region: Secondary | ICD-10-CM | POA: Diagnosis not present

## 2023-08-08 DIAGNOSIS — K4090 Unilateral inguinal hernia, without obstruction or gangrene, not specified as recurrent: Secondary | ICD-10-CM | POA: Diagnosis not present

## 2023-08-08 DIAGNOSIS — M7989 Other specified soft tissue disorders: Secondary | ICD-10-CM | POA: Diagnosis not present

## 2023-08-08 DIAGNOSIS — Z87891 Personal history of nicotine dependence: Secondary | ICD-10-CM | POA: Diagnosis not present

## 2023-08-09 DIAGNOSIS — F0280 Dementia in other diseases classified elsewhere without behavioral disturbance: Secondary | ICD-10-CM | POA: Diagnosis not present

## 2023-08-09 DIAGNOSIS — L821 Other seborrheic keratosis: Secondary | ICD-10-CM | POA: Diagnosis not present

## 2023-08-09 DIAGNOSIS — L570 Actinic keratosis: Secondary | ICD-10-CM | POA: Diagnosis not present

## 2023-08-09 DIAGNOSIS — M7989 Other specified soft tissue disorders: Secondary | ICD-10-CM | POA: Diagnosis not present

## 2023-08-09 DIAGNOSIS — G309 Alzheimer's disease, unspecified: Secondary | ICD-10-CM | POA: Diagnosis not present

## 2023-08-09 DIAGNOSIS — K4090 Unilateral inguinal hernia, without obstruction or gangrene, not specified as recurrent: Secondary | ICD-10-CM | POA: Diagnosis not present

## 2023-08-09 DIAGNOSIS — Z87891 Personal history of nicotine dependence: Secondary | ICD-10-CM | POA: Diagnosis not present

## 2023-08-09 DIAGNOSIS — D693 Immune thrombocytopenic purpura: Secondary | ICD-10-CM | POA: Diagnosis not present

## 2023-08-09 DIAGNOSIS — M5416 Radiculopathy, lumbar region: Secondary | ICD-10-CM | POA: Diagnosis not present

## 2023-08-14 DIAGNOSIS — K4090 Unilateral inguinal hernia, without obstruction or gangrene, not specified as recurrent: Secondary | ICD-10-CM | POA: Diagnosis not present

## 2023-08-14 DIAGNOSIS — M5416 Radiculopathy, lumbar region: Secondary | ICD-10-CM | POA: Diagnosis not present

## 2023-08-14 DIAGNOSIS — L570 Actinic keratosis: Secondary | ICD-10-CM | POA: Diagnosis not present

## 2023-08-14 DIAGNOSIS — G309 Alzheimer's disease, unspecified: Secondary | ICD-10-CM | POA: Diagnosis not present

## 2023-08-14 DIAGNOSIS — D693 Immune thrombocytopenic purpura: Secondary | ICD-10-CM | POA: Diagnosis not present

## 2023-08-14 DIAGNOSIS — M7989 Other specified soft tissue disorders: Secondary | ICD-10-CM | POA: Diagnosis not present

## 2023-08-14 DIAGNOSIS — L821 Other seborrheic keratosis: Secondary | ICD-10-CM | POA: Diagnosis not present

## 2023-08-14 DIAGNOSIS — Z87891 Personal history of nicotine dependence: Secondary | ICD-10-CM | POA: Diagnosis not present

## 2023-08-14 DIAGNOSIS — F0280 Dementia in other diseases classified elsewhere without behavioral disturbance: Secondary | ICD-10-CM | POA: Diagnosis not present

## 2023-08-15 DIAGNOSIS — K4090 Unilateral inguinal hernia, without obstruction or gangrene, not specified as recurrent: Secondary | ICD-10-CM | POA: Diagnosis not present

## 2023-08-15 DIAGNOSIS — L570 Actinic keratosis: Secondary | ICD-10-CM | POA: Diagnosis not present

## 2023-08-15 DIAGNOSIS — D693 Immune thrombocytopenic purpura: Secondary | ICD-10-CM | POA: Diagnosis not present

## 2023-08-15 DIAGNOSIS — M7989 Other specified soft tissue disorders: Secondary | ICD-10-CM | POA: Diagnosis not present

## 2023-08-15 DIAGNOSIS — M5416 Radiculopathy, lumbar region: Secondary | ICD-10-CM | POA: Diagnosis not present

## 2023-08-15 DIAGNOSIS — Z87891 Personal history of nicotine dependence: Secondary | ICD-10-CM | POA: Diagnosis not present

## 2023-08-15 DIAGNOSIS — F0280 Dementia in other diseases classified elsewhere without behavioral disturbance: Secondary | ICD-10-CM | POA: Diagnosis not present

## 2023-08-15 DIAGNOSIS — L821 Other seborrheic keratosis: Secondary | ICD-10-CM | POA: Diagnosis not present

## 2023-08-15 DIAGNOSIS — G309 Alzheimer's disease, unspecified: Secondary | ICD-10-CM | POA: Diagnosis not present

## 2023-08-20 DIAGNOSIS — N1831 Chronic kidney disease, stage 3a: Secondary | ICD-10-CM | POA: Diagnosis not present

## 2023-08-20 DIAGNOSIS — R5381 Other malaise: Secondary | ICD-10-CM | POA: Diagnosis not present

## 2023-08-20 DIAGNOSIS — L570 Actinic keratosis: Secondary | ICD-10-CM | POA: Diagnosis not present

## 2023-08-20 DIAGNOSIS — R32 Unspecified urinary incontinence: Secondary | ICD-10-CM | POA: Diagnosis not present

## 2023-08-20 DIAGNOSIS — F05 Delirium due to known physiological condition: Secondary | ICD-10-CM | POA: Diagnosis not present

## 2023-08-20 DIAGNOSIS — Z87891 Personal history of nicotine dependence: Secondary | ICD-10-CM | POA: Diagnosis not present

## 2023-08-20 DIAGNOSIS — D696 Thrombocytopenia, unspecified: Secondary | ICD-10-CM | POA: Diagnosis not present

## 2023-08-20 DIAGNOSIS — F0280 Dementia in other diseases classified elsewhere without behavioral disturbance: Secondary | ICD-10-CM | POA: Diagnosis not present

## 2023-08-20 DIAGNOSIS — Z0000 Encounter for general adult medical examination without abnormal findings: Secondary | ICD-10-CM | POA: Diagnosis not present

## 2023-08-20 DIAGNOSIS — D693 Immune thrombocytopenic purpura: Secondary | ICD-10-CM | POA: Diagnosis not present

## 2023-08-20 DIAGNOSIS — G309 Alzheimer's disease, unspecified: Secondary | ICD-10-CM | POA: Diagnosis not present

## 2023-08-20 DIAGNOSIS — K4090 Unilateral inguinal hernia, without obstruction or gangrene, not specified as recurrent: Secondary | ICD-10-CM | POA: Diagnosis not present

## 2023-08-20 DIAGNOSIS — M5416 Radiculopathy, lumbar region: Secondary | ICD-10-CM | POA: Diagnosis not present

## 2023-08-20 DIAGNOSIS — L821 Other seborrheic keratosis: Secondary | ICD-10-CM | POA: Diagnosis not present

## 2023-08-20 DIAGNOSIS — M7989 Other specified soft tissue disorders: Secondary | ICD-10-CM | POA: Diagnosis not present

## 2023-08-20 DIAGNOSIS — Z23 Encounter for immunization: Secondary | ICD-10-CM | POA: Diagnosis not present

## 2023-08-22 DIAGNOSIS — L570 Actinic keratosis: Secondary | ICD-10-CM | POA: Diagnosis not present

## 2023-08-22 DIAGNOSIS — M5416 Radiculopathy, lumbar region: Secondary | ICD-10-CM | POA: Diagnosis not present

## 2023-08-22 DIAGNOSIS — K4090 Unilateral inguinal hernia, without obstruction or gangrene, not specified as recurrent: Secondary | ICD-10-CM | POA: Diagnosis not present

## 2023-08-22 DIAGNOSIS — L821 Other seborrheic keratosis: Secondary | ICD-10-CM | POA: Diagnosis not present

## 2023-08-22 DIAGNOSIS — G309 Alzheimer's disease, unspecified: Secondary | ICD-10-CM | POA: Diagnosis not present

## 2023-08-22 DIAGNOSIS — F0280 Dementia in other diseases classified elsewhere without behavioral disturbance: Secondary | ICD-10-CM | POA: Diagnosis not present

## 2023-08-22 DIAGNOSIS — D693 Immune thrombocytopenic purpura: Secondary | ICD-10-CM | POA: Diagnosis not present

## 2023-08-22 DIAGNOSIS — M7989 Other specified soft tissue disorders: Secondary | ICD-10-CM | POA: Diagnosis not present

## 2023-08-22 DIAGNOSIS — Z87891 Personal history of nicotine dependence: Secondary | ICD-10-CM | POA: Diagnosis not present

## 2023-08-28 DIAGNOSIS — K4090 Unilateral inguinal hernia, without obstruction or gangrene, not specified as recurrent: Secondary | ICD-10-CM | POA: Diagnosis not present

## 2023-08-28 DIAGNOSIS — M7989 Other specified soft tissue disorders: Secondary | ICD-10-CM | POA: Diagnosis not present

## 2023-08-28 DIAGNOSIS — F0280 Dementia in other diseases classified elsewhere without behavioral disturbance: Secondary | ICD-10-CM | POA: Diagnosis not present

## 2023-08-28 DIAGNOSIS — D693 Immune thrombocytopenic purpura: Secondary | ICD-10-CM | POA: Diagnosis not present

## 2023-08-28 DIAGNOSIS — L570 Actinic keratosis: Secondary | ICD-10-CM | POA: Diagnosis not present

## 2023-08-28 DIAGNOSIS — G309 Alzheimer's disease, unspecified: Secondary | ICD-10-CM | POA: Diagnosis not present

## 2023-08-28 DIAGNOSIS — M5416 Radiculopathy, lumbar region: Secondary | ICD-10-CM | POA: Diagnosis not present

## 2023-08-28 DIAGNOSIS — L821 Other seborrheic keratosis: Secondary | ICD-10-CM | POA: Diagnosis not present

## 2023-08-28 DIAGNOSIS — Z87891 Personal history of nicotine dependence: Secondary | ICD-10-CM | POA: Diagnosis not present

## 2023-08-30 DIAGNOSIS — D693 Immune thrombocytopenic purpura: Secondary | ICD-10-CM | POA: Diagnosis not present

## 2023-08-30 DIAGNOSIS — F0280 Dementia in other diseases classified elsewhere without behavioral disturbance: Secondary | ICD-10-CM | POA: Diagnosis not present

## 2023-08-30 DIAGNOSIS — L821 Other seborrheic keratosis: Secondary | ICD-10-CM | POA: Diagnosis not present

## 2023-08-30 DIAGNOSIS — K4090 Unilateral inguinal hernia, without obstruction or gangrene, not specified as recurrent: Secondary | ICD-10-CM | POA: Diagnosis not present

## 2023-08-30 DIAGNOSIS — G309 Alzheimer's disease, unspecified: Secondary | ICD-10-CM | POA: Diagnosis not present

## 2023-08-30 DIAGNOSIS — M5416 Radiculopathy, lumbar region: Secondary | ICD-10-CM | POA: Diagnosis not present

## 2023-08-30 DIAGNOSIS — L570 Actinic keratosis: Secondary | ICD-10-CM | POA: Diagnosis not present

## 2023-08-30 DIAGNOSIS — M7989 Other specified soft tissue disorders: Secondary | ICD-10-CM | POA: Diagnosis not present

## 2023-08-30 DIAGNOSIS — Z87891 Personal history of nicotine dependence: Secondary | ICD-10-CM | POA: Diagnosis not present

## 2023-09-02 DIAGNOSIS — K4090 Unilateral inguinal hernia, without obstruction or gangrene, not specified as recurrent: Secondary | ICD-10-CM | POA: Diagnosis not present

## 2023-09-02 DIAGNOSIS — D693 Immune thrombocytopenic purpura: Secondary | ICD-10-CM | POA: Diagnosis not present

## 2023-09-02 DIAGNOSIS — L821 Other seborrheic keratosis: Secondary | ICD-10-CM | POA: Diagnosis not present

## 2023-09-02 DIAGNOSIS — F0280 Dementia in other diseases classified elsewhere without behavioral disturbance: Secondary | ICD-10-CM | POA: Diagnosis not present

## 2023-09-02 DIAGNOSIS — G309 Alzheimer's disease, unspecified: Secondary | ICD-10-CM | POA: Diagnosis not present

## 2023-09-02 DIAGNOSIS — M5416 Radiculopathy, lumbar region: Secondary | ICD-10-CM | POA: Diagnosis not present

## 2023-09-02 DIAGNOSIS — L570 Actinic keratosis: Secondary | ICD-10-CM | POA: Diagnosis not present

## 2023-09-02 DIAGNOSIS — Z87891 Personal history of nicotine dependence: Secondary | ICD-10-CM | POA: Diagnosis not present

## 2023-09-02 DIAGNOSIS — M7989 Other specified soft tissue disorders: Secondary | ICD-10-CM | POA: Diagnosis not present

## 2023-09-04 DIAGNOSIS — G309 Alzheimer's disease, unspecified: Secondary | ICD-10-CM | POA: Diagnosis not present

## 2023-09-04 DIAGNOSIS — M5416 Radiculopathy, lumbar region: Secondary | ICD-10-CM | POA: Diagnosis not present

## 2023-09-04 DIAGNOSIS — L821 Other seborrheic keratosis: Secondary | ICD-10-CM | POA: Diagnosis not present

## 2023-09-04 DIAGNOSIS — Z87891 Personal history of nicotine dependence: Secondary | ICD-10-CM | POA: Diagnosis not present

## 2023-09-04 DIAGNOSIS — M7989 Other specified soft tissue disorders: Secondary | ICD-10-CM | POA: Diagnosis not present

## 2023-09-04 DIAGNOSIS — L570 Actinic keratosis: Secondary | ICD-10-CM | POA: Diagnosis not present

## 2023-09-04 DIAGNOSIS — K4090 Unilateral inguinal hernia, without obstruction or gangrene, not specified as recurrent: Secondary | ICD-10-CM | POA: Diagnosis not present

## 2023-09-04 DIAGNOSIS — F0280 Dementia in other diseases classified elsewhere without behavioral disturbance: Secondary | ICD-10-CM | POA: Diagnosis not present

## 2023-09-04 DIAGNOSIS — D693 Immune thrombocytopenic purpura: Secondary | ICD-10-CM | POA: Diagnosis not present

## 2023-09-09 DIAGNOSIS — M5416 Radiculopathy, lumbar region: Secondary | ICD-10-CM | POA: Diagnosis not present

## 2023-09-09 DIAGNOSIS — K4090 Unilateral inguinal hernia, without obstruction or gangrene, not specified as recurrent: Secondary | ICD-10-CM | POA: Diagnosis not present

## 2023-09-09 DIAGNOSIS — F0280 Dementia in other diseases classified elsewhere without behavioral disturbance: Secondary | ICD-10-CM | POA: Diagnosis not present

## 2023-09-09 DIAGNOSIS — L570 Actinic keratosis: Secondary | ICD-10-CM | POA: Diagnosis not present

## 2023-09-09 DIAGNOSIS — M7989 Other specified soft tissue disorders: Secondary | ICD-10-CM | POA: Diagnosis not present

## 2023-09-09 DIAGNOSIS — L821 Other seborrheic keratosis: Secondary | ICD-10-CM | POA: Diagnosis not present

## 2023-09-09 DIAGNOSIS — Z87891 Personal history of nicotine dependence: Secondary | ICD-10-CM | POA: Diagnosis not present

## 2023-09-09 DIAGNOSIS — G309 Alzheimer's disease, unspecified: Secondary | ICD-10-CM | POA: Diagnosis not present

## 2023-09-09 DIAGNOSIS — D693 Immune thrombocytopenic purpura: Secondary | ICD-10-CM | POA: Diagnosis not present

## 2023-09-11 DIAGNOSIS — G309 Alzheimer's disease, unspecified: Secondary | ICD-10-CM | POA: Diagnosis not present

## 2023-09-11 DIAGNOSIS — K4090 Unilateral inguinal hernia, without obstruction or gangrene, not specified as recurrent: Secondary | ICD-10-CM | POA: Diagnosis not present

## 2023-09-11 DIAGNOSIS — M5416 Radiculopathy, lumbar region: Secondary | ICD-10-CM | POA: Diagnosis not present

## 2023-09-11 DIAGNOSIS — L570 Actinic keratosis: Secondary | ICD-10-CM | POA: Diagnosis not present

## 2023-09-11 DIAGNOSIS — F0280 Dementia in other diseases classified elsewhere without behavioral disturbance: Secondary | ICD-10-CM | POA: Diagnosis not present

## 2023-09-11 DIAGNOSIS — D693 Immune thrombocytopenic purpura: Secondary | ICD-10-CM | POA: Diagnosis not present

## 2023-09-11 DIAGNOSIS — L821 Other seborrheic keratosis: Secondary | ICD-10-CM | POA: Diagnosis not present

## 2023-09-11 DIAGNOSIS — M7989 Other specified soft tissue disorders: Secondary | ICD-10-CM | POA: Diagnosis not present

## 2023-09-11 DIAGNOSIS — Z87891 Personal history of nicotine dependence: Secondary | ICD-10-CM | POA: Diagnosis not present

## 2023-09-25 DIAGNOSIS — Z23 Encounter for immunization: Secondary | ICD-10-CM | POA: Diagnosis not present

## 2023-09-30 DIAGNOSIS — N3941 Urge incontinence: Secondary | ICD-10-CM | POA: Diagnosis not present

## 2023-10-17 DIAGNOSIS — L570 Actinic keratosis: Secondary | ICD-10-CM | POA: Diagnosis not present

## 2023-10-17 DIAGNOSIS — F05 Delirium due to known physiological condition: Secondary | ICD-10-CM | POA: Diagnosis not present

## 2023-10-17 DIAGNOSIS — N1831 Chronic kidney disease, stage 3a: Secondary | ICD-10-CM | POA: Diagnosis not present

## 2023-10-17 DIAGNOSIS — L821 Other seborrheic keratosis: Secondary | ICD-10-CM | POA: Diagnosis not present

## 2023-10-17 DIAGNOSIS — D693 Immune thrombocytopenic purpura: Secondary | ICD-10-CM | POA: Diagnosis not present

## 2023-10-17 DIAGNOSIS — G309 Alzheimer's disease, unspecified: Secondary | ICD-10-CM | POA: Diagnosis not present

## 2023-10-17 DIAGNOSIS — F02818 Dementia in other diseases classified elsewhere, unspecified severity, with other behavioral disturbance: Secondary | ICD-10-CM | POA: Diagnosis not present

## 2023-10-17 DIAGNOSIS — R32 Unspecified urinary incontinence: Secondary | ICD-10-CM | POA: Diagnosis not present

## 2023-10-17 DIAGNOSIS — M5416 Radiculopathy, lumbar region: Secondary | ICD-10-CM | POA: Diagnosis not present

## 2023-10-22 DIAGNOSIS — R32 Unspecified urinary incontinence: Secondary | ICD-10-CM | POA: Diagnosis not present

## 2023-10-22 DIAGNOSIS — F05 Delirium due to known physiological condition: Secondary | ICD-10-CM | POA: Diagnosis not present

## 2023-10-22 DIAGNOSIS — F02818 Dementia in other diseases classified elsewhere, unspecified severity, with other behavioral disturbance: Secondary | ICD-10-CM | POA: Diagnosis not present

## 2023-10-22 DIAGNOSIS — D693 Immune thrombocytopenic purpura: Secondary | ICD-10-CM | POA: Diagnosis not present

## 2023-10-22 DIAGNOSIS — L821 Other seborrheic keratosis: Secondary | ICD-10-CM | POA: Diagnosis not present

## 2023-10-22 DIAGNOSIS — N1831 Chronic kidney disease, stage 3a: Secondary | ICD-10-CM | POA: Diagnosis not present

## 2023-10-22 DIAGNOSIS — L570 Actinic keratosis: Secondary | ICD-10-CM | POA: Diagnosis not present

## 2023-10-22 DIAGNOSIS — M5416 Radiculopathy, lumbar region: Secondary | ICD-10-CM | POA: Diagnosis not present

## 2023-10-22 DIAGNOSIS — G309 Alzheimer's disease, unspecified: Secondary | ICD-10-CM | POA: Diagnosis not present

## 2023-10-28 DIAGNOSIS — R32 Unspecified urinary incontinence: Secondary | ICD-10-CM | POA: Diagnosis not present

## 2023-10-28 DIAGNOSIS — G309 Alzheimer's disease, unspecified: Secondary | ICD-10-CM | POA: Diagnosis not present

## 2023-10-28 DIAGNOSIS — D693 Immune thrombocytopenic purpura: Secondary | ICD-10-CM | POA: Diagnosis not present

## 2023-10-28 DIAGNOSIS — F05 Delirium due to known physiological condition: Secondary | ICD-10-CM | POA: Diagnosis not present

## 2023-10-28 DIAGNOSIS — L821 Other seborrheic keratosis: Secondary | ICD-10-CM | POA: Diagnosis not present

## 2023-10-28 DIAGNOSIS — N1831 Chronic kidney disease, stage 3a: Secondary | ICD-10-CM | POA: Diagnosis not present

## 2023-10-28 DIAGNOSIS — L570 Actinic keratosis: Secondary | ICD-10-CM | POA: Diagnosis not present

## 2023-10-28 DIAGNOSIS — F02818 Dementia in other diseases classified elsewhere, unspecified severity, with other behavioral disturbance: Secondary | ICD-10-CM | POA: Diagnosis not present

## 2023-10-28 DIAGNOSIS — M5416 Radiculopathy, lumbar region: Secondary | ICD-10-CM | POA: Diagnosis not present

## 2023-10-30 DIAGNOSIS — D693 Immune thrombocytopenic purpura: Secondary | ICD-10-CM | POA: Diagnosis not present

## 2023-10-30 DIAGNOSIS — L821 Other seborrheic keratosis: Secondary | ICD-10-CM | POA: Diagnosis not present

## 2023-10-30 DIAGNOSIS — M5416 Radiculopathy, lumbar region: Secondary | ICD-10-CM | POA: Diagnosis not present

## 2023-10-30 DIAGNOSIS — F05 Delirium due to known physiological condition: Secondary | ICD-10-CM | POA: Diagnosis not present

## 2023-10-30 DIAGNOSIS — F02818 Dementia in other diseases classified elsewhere, unspecified severity, with other behavioral disturbance: Secondary | ICD-10-CM | POA: Diagnosis not present

## 2023-10-30 DIAGNOSIS — R32 Unspecified urinary incontinence: Secondary | ICD-10-CM | POA: Diagnosis not present

## 2023-10-30 DIAGNOSIS — L570 Actinic keratosis: Secondary | ICD-10-CM | POA: Diagnosis not present

## 2023-10-30 DIAGNOSIS — N1831 Chronic kidney disease, stage 3a: Secondary | ICD-10-CM | POA: Diagnosis not present

## 2023-10-30 DIAGNOSIS — G309 Alzheimer's disease, unspecified: Secondary | ICD-10-CM | POA: Diagnosis not present

## 2023-10-31 DIAGNOSIS — R32 Unspecified urinary incontinence: Secondary | ICD-10-CM | POA: Diagnosis not present

## 2023-10-31 DIAGNOSIS — M5416 Radiculopathy, lumbar region: Secondary | ICD-10-CM | POA: Diagnosis not present

## 2023-10-31 DIAGNOSIS — F05 Delirium due to known physiological condition: Secondary | ICD-10-CM | POA: Diagnosis not present

## 2023-10-31 DIAGNOSIS — N1831 Chronic kidney disease, stage 3a: Secondary | ICD-10-CM | POA: Diagnosis not present

## 2023-10-31 DIAGNOSIS — G309 Alzheimer's disease, unspecified: Secondary | ICD-10-CM | POA: Diagnosis not present

## 2023-10-31 DIAGNOSIS — F02818 Dementia in other diseases classified elsewhere, unspecified severity, with other behavioral disturbance: Secondary | ICD-10-CM | POA: Diagnosis not present

## 2023-10-31 DIAGNOSIS — L821 Other seborrheic keratosis: Secondary | ICD-10-CM | POA: Diagnosis not present

## 2023-10-31 DIAGNOSIS — D693 Immune thrombocytopenic purpura: Secondary | ICD-10-CM | POA: Diagnosis not present

## 2023-10-31 DIAGNOSIS — L570 Actinic keratosis: Secondary | ICD-10-CM | POA: Diagnosis not present

## 2023-11-04 DIAGNOSIS — N1831 Chronic kidney disease, stage 3a: Secondary | ICD-10-CM | POA: Diagnosis not present

## 2023-11-04 DIAGNOSIS — L821 Other seborrheic keratosis: Secondary | ICD-10-CM | POA: Diagnosis not present

## 2023-11-04 DIAGNOSIS — D693 Immune thrombocytopenic purpura: Secondary | ICD-10-CM | POA: Diagnosis not present

## 2023-11-04 DIAGNOSIS — F05 Delirium due to known physiological condition: Secondary | ICD-10-CM | POA: Diagnosis not present

## 2023-11-04 DIAGNOSIS — M5416 Radiculopathy, lumbar region: Secondary | ICD-10-CM | POA: Diagnosis not present

## 2023-11-04 DIAGNOSIS — F02818 Dementia in other diseases classified elsewhere, unspecified severity, with other behavioral disturbance: Secondary | ICD-10-CM | POA: Diagnosis not present

## 2023-11-04 DIAGNOSIS — L570 Actinic keratosis: Secondary | ICD-10-CM | POA: Diagnosis not present

## 2023-11-04 DIAGNOSIS — G309 Alzheimer's disease, unspecified: Secondary | ICD-10-CM | POA: Diagnosis not present

## 2023-11-04 DIAGNOSIS — R32 Unspecified urinary incontinence: Secondary | ICD-10-CM | POA: Diagnosis not present

## 2023-11-05 DIAGNOSIS — N1831 Chronic kidney disease, stage 3a: Secondary | ICD-10-CM | POA: Diagnosis not present

## 2023-11-05 DIAGNOSIS — G309 Alzheimer's disease, unspecified: Secondary | ICD-10-CM | POA: Diagnosis not present

## 2023-11-05 DIAGNOSIS — L821 Other seborrheic keratosis: Secondary | ICD-10-CM | POA: Diagnosis not present

## 2023-11-05 DIAGNOSIS — R32 Unspecified urinary incontinence: Secondary | ICD-10-CM | POA: Diagnosis not present

## 2023-11-05 DIAGNOSIS — F02818 Dementia in other diseases classified elsewhere, unspecified severity, with other behavioral disturbance: Secondary | ICD-10-CM | POA: Diagnosis not present

## 2023-11-05 DIAGNOSIS — M5416 Radiculopathy, lumbar region: Secondary | ICD-10-CM | POA: Diagnosis not present

## 2023-11-05 DIAGNOSIS — F05 Delirium due to known physiological condition: Secondary | ICD-10-CM | POA: Diagnosis not present

## 2023-11-05 DIAGNOSIS — L570 Actinic keratosis: Secondary | ICD-10-CM | POA: Diagnosis not present

## 2023-11-05 DIAGNOSIS — D693 Immune thrombocytopenic purpura: Secondary | ICD-10-CM | POA: Diagnosis not present

## 2023-11-06 DIAGNOSIS — G309 Alzheimer's disease, unspecified: Secondary | ICD-10-CM | POA: Diagnosis not present

## 2023-11-06 DIAGNOSIS — N1831 Chronic kidney disease, stage 3a: Secondary | ICD-10-CM | POA: Diagnosis not present

## 2023-11-06 DIAGNOSIS — M5416 Radiculopathy, lumbar region: Secondary | ICD-10-CM | POA: Diagnosis not present

## 2023-11-06 DIAGNOSIS — L821 Other seborrheic keratosis: Secondary | ICD-10-CM | POA: Diagnosis not present

## 2023-11-06 DIAGNOSIS — D693 Immune thrombocytopenic purpura: Secondary | ICD-10-CM | POA: Diagnosis not present

## 2023-11-06 DIAGNOSIS — L570 Actinic keratosis: Secondary | ICD-10-CM | POA: Diagnosis not present

## 2023-11-06 DIAGNOSIS — R32 Unspecified urinary incontinence: Secondary | ICD-10-CM | POA: Diagnosis not present

## 2023-11-06 DIAGNOSIS — F02818 Dementia in other diseases classified elsewhere, unspecified severity, with other behavioral disturbance: Secondary | ICD-10-CM | POA: Diagnosis not present

## 2023-11-06 DIAGNOSIS — F05 Delirium due to known physiological condition: Secondary | ICD-10-CM | POA: Diagnosis not present

## 2023-11-11 DIAGNOSIS — F02818 Dementia in other diseases classified elsewhere, unspecified severity, with other behavioral disturbance: Secondary | ICD-10-CM | POA: Diagnosis not present

## 2023-11-11 DIAGNOSIS — M5416 Radiculopathy, lumbar region: Secondary | ICD-10-CM | POA: Diagnosis not present

## 2023-11-11 DIAGNOSIS — N1831 Chronic kidney disease, stage 3a: Secondary | ICD-10-CM | POA: Diagnosis not present

## 2023-11-11 DIAGNOSIS — L570 Actinic keratosis: Secondary | ICD-10-CM | POA: Diagnosis not present

## 2023-11-11 DIAGNOSIS — F05 Delirium due to known physiological condition: Secondary | ICD-10-CM | POA: Diagnosis not present

## 2023-11-11 DIAGNOSIS — L821 Other seborrheic keratosis: Secondary | ICD-10-CM | POA: Diagnosis not present

## 2023-11-11 DIAGNOSIS — G309 Alzheimer's disease, unspecified: Secondary | ICD-10-CM | POA: Diagnosis not present

## 2023-11-11 DIAGNOSIS — R32 Unspecified urinary incontinence: Secondary | ICD-10-CM | POA: Diagnosis not present

## 2023-11-11 DIAGNOSIS — D693 Immune thrombocytopenic purpura: Secondary | ICD-10-CM | POA: Diagnosis not present

## 2023-11-12 DIAGNOSIS — N1831 Chronic kidney disease, stage 3a: Secondary | ICD-10-CM | POA: Diagnosis not present

## 2023-11-12 DIAGNOSIS — R32 Unspecified urinary incontinence: Secondary | ICD-10-CM | POA: Diagnosis not present

## 2023-11-12 DIAGNOSIS — L821 Other seborrheic keratosis: Secondary | ICD-10-CM | POA: Diagnosis not present

## 2023-11-12 DIAGNOSIS — D693 Immune thrombocytopenic purpura: Secondary | ICD-10-CM | POA: Diagnosis not present

## 2023-11-12 DIAGNOSIS — M5416 Radiculopathy, lumbar region: Secondary | ICD-10-CM | POA: Diagnosis not present

## 2023-11-12 DIAGNOSIS — F02818 Dementia in other diseases classified elsewhere, unspecified severity, with other behavioral disturbance: Secondary | ICD-10-CM | POA: Diagnosis not present

## 2023-11-12 DIAGNOSIS — G309 Alzheimer's disease, unspecified: Secondary | ICD-10-CM | POA: Diagnosis not present

## 2023-11-12 DIAGNOSIS — F05 Delirium due to known physiological condition: Secondary | ICD-10-CM | POA: Diagnosis not present

## 2023-11-12 DIAGNOSIS — L570 Actinic keratosis: Secondary | ICD-10-CM | POA: Diagnosis not present

## 2023-11-18 DIAGNOSIS — L821 Other seborrheic keratosis: Secondary | ICD-10-CM | POA: Diagnosis not present

## 2023-11-18 DIAGNOSIS — F05 Delirium due to known physiological condition: Secondary | ICD-10-CM | POA: Diagnosis not present

## 2023-11-18 DIAGNOSIS — R32 Unspecified urinary incontinence: Secondary | ICD-10-CM | POA: Diagnosis not present

## 2023-11-18 DIAGNOSIS — D693 Immune thrombocytopenic purpura: Secondary | ICD-10-CM | POA: Diagnosis not present

## 2023-11-18 DIAGNOSIS — L570 Actinic keratosis: Secondary | ICD-10-CM | POA: Diagnosis not present

## 2023-11-18 DIAGNOSIS — F02818 Dementia in other diseases classified elsewhere, unspecified severity, with other behavioral disturbance: Secondary | ICD-10-CM | POA: Diagnosis not present

## 2023-11-18 DIAGNOSIS — M5416 Radiculopathy, lumbar region: Secondary | ICD-10-CM | POA: Diagnosis not present

## 2023-11-18 DIAGNOSIS — G309 Alzheimer's disease, unspecified: Secondary | ICD-10-CM | POA: Diagnosis not present

## 2023-11-18 DIAGNOSIS — N1831 Chronic kidney disease, stage 3a: Secondary | ICD-10-CM | POA: Diagnosis not present

## 2023-11-19 DIAGNOSIS — L821 Other seborrheic keratosis: Secondary | ICD-10-CM | POA: Diagnosis not present

## 2023-11-19 DIAGNOSIS — F05 Delirium due to known physiological condition: Secondary | ICD-10-CM | POA: Diagnosis not present

## 2023-11-19 DIAGNOSIS — G309 Alzheimer's disease, unspecified: Secondary | ICD-10-CM | POA: Diagnosis not present

## 2023-11-19 DIAGNOSIS — F02818 Dementia in other diseases classified elsewhere, unspecified severity, with other behavioral disturbance: Secondary | ICD-10-CM | POA: Diagnosis not present

## 2023-11-19 DIAGNOSIS — L570 Actinic keratosis: Secondary | ICD-10-CM | POA: Diagnosis not present

## 2023-11-19 DIAGNOSIS — M5416 Radiculopathy, lumbar region: Secondary | ICD-10-CM | POA: Diagnosis not present

## 2023-11-19 DIAGNOSIS — N1831 Chronic kidney disease, stage 3a: Secondary | ICD-10-CM | POA: Diagnosis not present

## 2023-11-19 DIAGNOSIS — R32 Unspecified urinary incontinence: Secondary | ICD-10-CM | POA: Diagnosis not present

## 2023-11-19 DIAGNOSIS — D693 Immune thrombocytopenic purpura: Secondary | ICD-10-CM | POA: Diagnosis not present

## 2023-11-25 DIAGNOSIS — M5416 Radiculopathy, lumbar region: Secondary | ICD-10-CM | POA: Diagnosis not present

## 2023-11-25 DIAGNOSIS — F02818 Dementia in other diseases classified elsewhere, unspecified severity, with other behavioral disturbance: Secondary | ICD-10-CM | POA: Diagnosis not present

## 2023-11-25 DIAGNOSIS — R32 Unspecified urinary incontinence: Secondary | ICD-10-CM | POA: Diagnosis not present

## 2023-11-25 DIAGNOSIS — F05 Delirium due to known physiological condition: Secondary | ICD-10-CM | POA: Diagnosis not present

## 2023-11-25 DIAGNOSIS — L570 Actinic keratosis: Secondary | ICD-10-CM | POA: Diagnosis not present

## 2023-11-25 DIAGNOSIS — L821 Other seborrheic keratosis: Secondary | ICD-10-CM | POA: Diagnosis not present

## 2023-11-25 DIAGNOSIS — G309 Alzheimer's disease, unspecified: Secondary | ICD-10-CM | POA: Diagnosis not present

## 2023-11-25 DIAGNOSIS — N1831 Chronic kidney disease, stage 3a: Secondary | ICD-10-CM | POA: Diagnosis not present

## 2023-11-25 DIAGNOSIS — D693 Immune thrombocytopenic purpura: Secondary | ICD-10-CM | POA: Diagnosis not present

## 2023-11-27 DIAGNOSIS — R32 Unspecified urinary incontinence: Secondary | ICD-10-CM | POA: Diagnosis not present

## 2023-11-27 DIAGNOSIS — N1831 Chronic kidney disease, stage 3a: Secondary | ICD-10-CM | POA: Diagnosis not present

## 2023-11-27 DIAGNOSIS — M5416 Radiculopathy, lumbar region: Secondary | ICD-10-CM | POA: Diagnosis not present

## 2023-11-27 DIAGNOSIS — G309 Alzheimer's disease, unspecified: Secondary | ICD-10-CM | POA: Diagnosis not present

## 2023-11-27 DIAGNOSIS — L570 Actinic keratosis: Secondary | ICD-10-CM | POA: Diagnosis not present

## 2023-11-27 DIAGNOSIS — L821 Other seborrheic keratosis: Secondary | ICD-10-CM | POA: Diagnosis not present

## 2023-11-27 DIAGNOSIS — F05 Delirium due to known physiological condition: Secondary | ICD-10-CM | POA: Diagnosis not present

## 2023-11-27 DIAGNOSIS — F02818 Dementia in other diseases classified elsewhere, unspecified severity, with other behavioral disturbance: Secondary | ICD-10-CM | POA: Diagnosis not present

## 2023-11-27 DIAGNOSIS — D693 Immune thrombocytopenic purpura: Secondary | ICD-10-CM | POA: Diagnosis not present

## 2023-11-28 DIAGNOSIS — D693 Immune thrombocytopenic purpura: Secondary | ICD-10-CM | POA: Diagnosis not present

## 2023-11-28 DIAGNOSIS — R32 Unspecified urinary incontinence: Secondary | ICD-10-CM | POA: Diagnosis not present

## 2023-11-28 DIAGNOSIS — F02818 Dementia in other diseases classified elsewhere, unspecified severity, with other behavioral disturbance: Secondary | ICD-10-CM | POA: Diagnosis not present

## 2023-11-28 DIAGNOSIS — N1831 Chronic kidney disease, stage 3a: Secondary | ICD-10-CM | POA: Diagnosis not present

## 2023-11-28 DIAGNOSIS — L570 Actinic keratosis: Secondary | ICD-10-CM | POA: Diagnosis not present

## 2023-11-28 DIAGNOSIS — M5416 Radiculopathy, lumbar region: Secondary | ICD-10-CM | POA: Diagnosis not present

## 2023-11-28 DIAGNOSIS — F05 Delirium due to known physiological condition: Secondary | ICD-10-CM | POA: Diagnosis not present

## 2023-11-28 DIAGNOSIS — G309 Alzheimer's disease, unspecified: Secondary | ICD-10-CM | POA: Diagnosis not present

## 2023-11-28 DIAGNOSIS — L821 Other seborrheic keratosis: Secondary | ICD-10-CM | POA: Diagnosis not present

## 2023-11-29 DIAGNOSIS — F02818 Dementia in other diseases classified elsewhere, unspecified severity, with other behavioral disturbance: Secondary | ICD-10-CM | POA: Diagnosis not present

## 2023-11-29 DIAGNOSIS — F05 Delirium due to known physiological condition: Secondary | ICD-10-CM | POA: Diagnosis not present

## 2023-11-29 DIAGNOSIS — R32 Unspecified urinary incontinence: Secondary | ICD-10-CM | POA: Diagnosis not present

## 2023-11-29 DIAGNOSIS — D693 Immune thrombocytopenic purpura: Secondary | ICD-10-CM | POA: Diagnosis not present

## 2023-11-29 DIAGNOSIS — L570 Actinic keratosis: Secondary | ICD-10-CM | POA: Diagnosis not present

## 2023-11-29 DIAGNOSIS — G309 Alzheimer's disease, unspecified: Secondary | ICD-10-CM | POA: Diagnosis not present

## 2023-11-29 DIAGNOSIS — M5416 Radiculopathy, lumbar region: Secondary | ICD-10-CM | POA: Diagnosis not present

## 2023-11-29 DIAGNOSIS — L821 Other seborrheic keratosis: Secondary | ICD-10-CM | POA: Diagnosis not present

## 2023-11-29 DIAGNOSIS — N1831 Chronic kidney disease, stage 3a: Secondary | ICD-10-CM | POA: Diagnosis not present

## 2023-12-16 DIAGNOSIS — F02818 Dementia in other diseases classified elsewhere, unspecified severity, with other behavioral disturbance: Secondary | ICD-10-CM | POA: Diagnosis not present

## 2023-12-16 DIAGNOSIS — D693 Immune thrombocytopenic purpura: Secondary | ICD-10-CM | POA: Diagnosis not present

## 2023-12-16 DIAGNOSIS — N1831 Chronic kidney disease, stage 3a: Secondary | ICD-10-CM | POA: Diagnosis not present

## 2023-12-16 DIAGNOSIS — L821 Other seborrheic keratosis: Secondary | ICD-10-CM | POA: Diagnosis not present

## 2023-12-16 DIAGNOSIS — G309 Alzheimer's disease, unspecified: Secondary | ICD-10-CM | POA: Diagnosis not present

## 2023-12-16 DIAGNOSIS — M5416 Radiculopathy, lumbar region: Secondary | ICD-10-CM | POA: Diagnosis not present

## 2023-12-16 DIAGNOSIS — L570 Actinic keratosis: Secondary | ICD-10-CM | POA: Diagnosis not present

## 2023-12-16 DIAGNOSIS — R32 Unspecified urinary incontinence: Secondary | ICD-10-CM | POA: Diagnosis not present

## 2023-12-16 DIAGNOSIS — F05 Delirium due to known physiological condition: Secondary | ICD-10-CM | POA: Diagnosis not present

## 2023-12-24 DIAGNOSIS — M5416 Radiculopathy, lumbar region: Secondary | ICD-10-CM | POA: Diagnosis not present

## 2023-12-24 DIAGNOSIS — R32 Unspecified urinary incontinence: Secondary | ICD-10-CM | POA: Diagnosis not present

## 2023-12-24 DIAGNOSIS — F02818 Dementia in other diseases classified elsewhere, unspecified severity, with other behavioral disturbance: Secondary | ICD-10-CM | POA: Diagnosis not present

## 2023-12-24 DIAGNOSIS — G309 Alzheimer's disease, unspecified: Secondary | ICD-10-CM | POA: Diagnosis not present

## 2023-12-24 DIAGNOSIS — N1831 Chronic kidney disease, stage 3a: Secondary | ICD-10-CM | POA: Diagnosis not present

## 2023-12-24 DIAGNOSIS — L821 Other seborrheic keratosis: Secondary | ICD-10-CM | POA: Diagnosis not present

## 2023-12-24 DIAGNOSIS — D693 Immune thrombocytopenic purpura: Secondary | ICD-10-CM | POA: Diagnosis not present

## 2023-12-24 DIAGNOSIS — L570 Actinic keratosis: Secondary | ICD-10-CM | POA: Diagnosis not present

## 2023-12-24 DIAGNOSIS — F05 Delirium due to known physiological condition: Secondary | ICD-10-CM | POA: Diagnosis not present

## 2023-12-31 DIAGNOSIS — F02818 Dementia in other diseases classified elsewhere, unspecified severity, with other behavioral disturbance: Secondary | ICD-10-CM | POA: Diagnosis not present

## 2023-12-31 DIAGNOSIS — M5416 Radiculopathy, lumbar region: Secondary | ICD-10-CM | POA: Diagnosis not present

## 2023-12-31 DIAGNOSIS — D693 Immune thrombocytopenic purpura: Secondary | ICD-10-CM | POA: Diagnosis not present

## 2023-12-31 DIAGNOSIS — L570 Actinic keratosis: Secondary | ICD-10-CM | POA: Diagnosis not present

## 2023-12-31 DIAGNOSIS — G309 Alzheimer's disease, unspecified: Secondary | ICD-10-CM | POA: Diagnosis not present

## 2023-12-31 DIAGNOSIS — L821 Other seborrheic keratosis: Secondary | ICD-10-CM | POA: Diagnosis not present

## 2023-12-31 DIAGNOSIS — R32 Unspecified urinary incontinence: Secondary | ICD-10-CM | POA: Diagnosis not present

## 2023-12-31 DIAGNOSIS — F05 Delirium due to known physiological condition: Secondary | ICD-10-CM | POA: Diagnosis not present

## 2023-12-31 DIAGNOSIS — N1831 Chronic kidney disease, stage 3a: Secondary | ICD-10-CM | POA: Diagnosis not present

## 2024-01-06 DIAGNOSIS — N3941 Urge incontinence: Secondary | ICD-10-CM | POA: Diagnosis not present

## 2024-01-07 DIAGNOSIS — L570 Actinic keratosis: Secondary | ICD-10-CM | POA: Diagnosis not present

## 2024-01-07 DIAGNOSIS — M5416 Radiculopathy, lumbar region: Secondary | ICD-10-CM | POA: Diagnosis not present

## 2024-01-07 DIAGNOSIS — D693 Immune thrombocytopenic purpura: Secondary | ICD-10-CM | POA: Diagnosis not present

## 2024-01-07 DIAGNOSIS — N1831 Chronic kidney disease, stage 3a: Secondary | ICD-10-CM | POA: Diagnosis not present

## 2024-01-07 DIAGNOSIS — F02818 Dementia in other diseases classified elsewhere, unspecified severity, with other behavioral disturbance: Secondary | ICD-10-CM | POA: Diagnosis not present

## 2024-01-07 DIAGNOSIS — R32 Unspecified urinary incontinence: Secondary | ICD-10-CM | POA: Diagnosis not present

## 2024-01-07 DIAGNOSIS — L821 Other seborrheic keratosis: Secondary | ICD-10-CM | POA: Diagnosis not present

## 2024-01-07 DIAGNOSIS — F05 Delirium due to known physiological condition: Secondary | ICD-10-CM | POA: Diagnosis not present

## 2024-01-07 DIAGNOSIS — G309 Alzheimer's disease, unspecified: Secondary | ICD-10-CM | POA: Diagnosis not present
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
## 2024-01-24 ENCOUNTER — Ambulatory Visit: Payer: Medicare PPO | Admitting: Neurology

## 2024-01-24 ENCOUNTER — Encounter: Payer: Self-pay | Admitting: Neurology

## 2024-01-24 DIAGNOSIS — F0280 Dementia in other diseases classified elsewhere without behavioral disturbance: Secondary | ICD-10-CM | POA: Diagnosis not present

## 2024-01-24 DIAGNOSIS — G301 Alzheimer's disease with late onset: Secondary | ICD-10-CM | POA: Diagnosis not present

## 2024-01-24 MED ORDER — MEMANTINE HCL 10 MG PO TABS: 10.0000 mg | ORAL_TABLET | Freq: Two times a day (BID) | ORAL | 3 refills | Status: DC

## 2024-01-24 MED ORDER — DONEPEZIL HCL 10 MG PO TABS
ORAL_TABLET | ORAL | 3 refills | Status: DC
Start: 2024-01-24 — End: 2024-11-06

## 2024-01-24 MED ORDER — LORAZEPAM 1 MG PO TABS: 1.0000 mg | ORAL_TABLET | Freq: Every evening | ORAL | 2 refills | Status: DC | PRN

## 2024-01-24 NOTE — Patient Instructions (Signed)
 Refilled donepezil , memantine  and lorazepam .  Try 1mg  at bedtime if needed to see if it will prevent the shaking

## 2024-01-31 DIAGNOSIS — G309 Alzheimer's disease, unspecified: Secondary | ICD-10-CM | POA: Diagnosis not present

## 2024-01-31 DIAGNOSIS — N1831 Chronic kidney disease, stage 3a: Secondary | ICD-10-CM | POA: Diagnosis not present

## 2024-01-31 DIAGNOSIS — F02818 Dementia in other diseases classified elsewhere, unspecified severity, with other behavioral disturbance: Secondary | ICD-10-CM | POA: Diagnosis not present

## 2024-01-31 DIAGNOSIS — L570 Actinic keratosis: Secondary | ICD-10-CM | POA: Diagnosis not present

## 2024-01-31 DIAGNOSIS — L821 Other seborrheic keratosis: Secondary | ICD-10-CM | POA: Diagnosis not present

## 2024-01-31 DIAGNOSIS — M5416 Radiculopathy, lumbar region: Secondary | ICD-10-CM | POA: Diagnosis not present

## 2024-01-31 DIAGNOSIS — R32 Unspecified urinary incontinence: Secondary | ICD-10-CM | POA: Diagnosis not present

## 2024-01-31 DIAGNOSIS — F05 Delirium due to known physiological condition: Secondary | ICD-10-CM | POA: Diagnosis not present

## 2024-01-31 DIAGNOSIS — D693 Immune thrombocytopenic purpura: Secondary | ICD-10-CM | POA: Diagnosis not present

## 2024-02-09 DIAGNOSIS — F05 Delirium due to known physiological condition: Secondary | ICD-10-CM | POA: Diagnosis not present

## 2024-02-09 DIAGNOSIS — D693 Immune thrombocytopenic purpura: Secondary | ICD-10-CM | POA: Diagnosis not present

## 2024-02-09 DIAGNOSIS — M5416 Radiculopathy, lumbar region: Secondary | ICD-10-CM | POA: Diagnosis not present

## 2024-02-09 DIAGNOSIS — R32 Unspecified urinary incontinence: Secondary | ICD-10-CM | POA: Diagnosis not present

## 2024-02-09 DIAGNOSIS — L821 Other seborrheic keratosis: Secondary | ICD-10-CM | POA: Diagnosis not present

## 2024-02-09 DIAGNOSIS — G309 Alzheimer's disease, unspecified: Secondary | ICD-10-CM | POA: Diagnosis not present

## 2024-02-09 DIAGNOSIS — N1831 Chronic kidney disease, stage 3a: Secondary | ICD-10-CM | POA: Diagnosis not present

## 2024-02-09 DIAGNOSIS — F02818 Dementia in other diseases classified elsewhere, unspecified severity, with other behavioral disturbance: Secondary | ICD-10-CM | POA: Diagnosis not present

## 2024-02-09 DIAGNOSIS — L570 Actinic keratosis: Secondary | ICD-10-CM | POA: Diagnosis not present

## 2024-02-12 DIAGNOSIS — L570 Actinic keratosis: Secondary | ICD-10-CM | POA: Diagnosis not present

## 2024-02-12 DIAGNOSIS — D693 Immune thrombocytopenic purpura: Secondary | ICD-10-CM | POA: Diagnosis not present

## 2024-02-12 DIAGNOSIS — F05 Delirium due to known physiological condition: Secondary | ICD-10-CM | POA: Diagnosis not present

## 2024-02-12 DIAGNOSIS — L821 Other seborrheic keratosis: Secondary | ICD-10-CM | POA: Diagnosis not present

## 2024-02-12 DIAGNOSIS — F02818 Dementia in other diseases classified elsewhere, unspecified severity, with other behavioral disturbance: Secondary | ICD-10-CM | POA: Diagnosis not present

## 2024-02-12 DIAGNOSIS — R32 Unspecified urinary incontinence: Secondary | ICD-10-CM | POA: Diagnosis not present

## 2024-02-12 DIAGNOSIS — N1831 Chronic kidney disease, stage 3a: Secondary | ICD-10-CM | POA: Diagnosis not present

## 2024-02-12 DIAGNOSIS — M5416 Radiculopathy, lumbar region: Secondary | ICD-10-CM | POA: Diagnosis not present

## 2024-02-12 DIAGNOSIS — G309 Alzheimer's disease, unspecified: Secondary | ICD-10-CM | POA: Diagnosis not present

## 2024-03-09 DIAGNOSIS — R32 Unspecified urinary incontinence: Secondary | ICD-10-CM | POA: Diagnosis not present

## 2024-03-09 DIAGNOSIS — F0280 Dementia in other diseases classified elsewhere without behavioral disturbance: Secondary | ICD-10-CM | POA: Diagnosis not present

## 2024-03-09 DIAGNOSIS — G309 Alzheimer's disease, unspecified: Secondary | ICD-10-CM | POA: Diagnosis not present

## 2024-03-09 DIAGNOSIS — Z136 Encounter for screening for cardiovascular disorders: Secondary | ICD-10-CM | POA: Diagnosis not present

## 2024-03-09 DIAGNOSIS — N1831 Chronic kidney disease, stage 3a: Secondary | ICD-10-CM | POA: Diagnosis not present

## 2024-03-09 DIAGNOSIS — F05 Delirium due to known physiological condition: Secondary | ICD-10-CM | POA: Diagnosis not present

## 2024-03-09 DIAGNOSIS — R5383 Other fatigue: Secondary | ICD-10-CM | POA: Diagnosis not present

## 2024-03-09 DIAGNOSIS — R5381 Other malaise: Secondary | ICD-10-CM | POA: Diagnosis not present

## 2024-03-09 DIAGNOSIS — Z13220 Encounter for screening for lipoid disorders: Secondary | ICD-10-CM | POA: Diagnosis not present

## 2024-03-09 DIAGNOSIS — D696 Thrombocytopenia, unspecified: Secondary | ICD-10-CM | POA: Diagnosis not present

## 2024-03-11 DIAGNOSIS — G309 Alzheimer's disease, unspecified: Secondary | ICD-10-CM | POA: Diagnosis not present

## 2024-03-11 DIAGNOSIS — R5383 Other fatigue: Secondary | ICD-10-CM | POA: Diagnosis not present

## 2024-03-11 DIAGNOSIS — D693 Immune thrombocytopenic purpura: Secondary | ICD-10-CM | POA: Diagnosis not present

## 2024-03-11 DIAGNOSIS — N400 Enlarged prostate without lower urinary tract symptoms: Secondary | ICD-10-CM | POA: Diagnosis not present

## 2024-03-11 DIAGNOSIS — R32 Unspecified urinary incontinence: Secondary | ICD-10-CM | POA: Diagnosis not present

## 2024-03-11 DIAGNOSIS — F0280 Dementia in other diseases classified elsewhere without behavioral disturbance: Secondary | ICD-10-CM | POA: Diagnosis not present

## 2024-03-11 DIAGNOSIS — M5416 Radiculopathy, lumbar region: Secondary | ICD-10-CM | POA: Diagnosis not present

## 2024-03-11 DIAGNOSIS — N1831 Chronic kidney disease, stage 3a: Secondary | ICD-10-CM | POA: Diagnosis not present

## 2024-03-11 DIAGNOSIS — F05 Delirium due to known physiological condition: Secondary | ICD-10-CM | POA: Diagnosis not present

## 2024-03-17 ENCOUNTER — Encounter: Payer: Self-pay | Admitting: Neurology

## 2024-03-18 DIAGNOSIS — R32 Unspecified urinary incontinence: Secondary | ICD-10-CM | POA: Diagnosis not present

## 2024-03-18 DIAGNOSIS — R5383 Other fatigue: Secondary | ICD-10-CM | POA: Diagnosis not present

## 2024-03-18 DIAGNOSIS — F0280 Dementia in other diseases classified elsewhere without behavioral disturbance: Secondary | ICD-10-CM | POA: Diagnosis not present

## 2024-03-18 DIAGNOSIS — N1831 Chronic kidney disease, stage 3a: Secondary | ICD-10-CM | POA: Diagnosis not present

## 2024-03-18 DIAGNOSIS — G309 Alzheimer's disease, unspecified: Secondary | ICD-10-CM | POA: Diagnosis not present

## 2024-03-18 DIAGNOSIS — D693 Immune thrombocytopenic purpura: Secondary | ICD-10-CM | POA: Diagnosis not present

## 2024-03-18 DIAGNOSIS — M5416 Radiculopathy, lumbar region: Secondary | ICD-10-CM | POA: Diagnosis not present

## 2024-03-18 DIAGNOSIS — F05 Delirium due to known physiological condition: Secondary | ICD-10-CM | POA: Diagnosis not present

## 2024-03-18 DIAGNOSIS — N400 Enlarged prostate without lower urinary tract symptoms: Secondary | ICD-10-CM | POA: Diagnosis not present

## 2024-03-23 DIAGNOSIS — N1831 Chronic kidney disease, stage 3a: Secondary | ICD-10-CM | POA: Diagnosis not present

## 2024-03-23 DIAGNOSIS — R5383 Other fatigue: Secondary | ICD-10-CM | POA: Diagnosis not present

## 2024-03-23 DIAGNOSIS — R32 Unspecified urinary incontinence: Secondary | ICD-10-CM | POA: Diagnosis not present

## 2024-03-23 DIAGNOSIS — F05 Delirium due to known physiological condition: Secondary | ICD-10-CM | POA: Diagnosis not present

## 2024-03-23 DIAGNOSIS — G309 Alzheimer's disease, unspecified: Secondary | ICD-10-CM | POA: Diagnosis not present

## 2024-03-23 DIAGNOSIS — N400 Enlarged prostate without lower urinary tract symptoms: Secondary | ICD-10-CM | POA: Diagnosis not present

## 2024-03-23 DIAGNOSIS — M5416 Radiculopathy, lumbar region: Secondary | ICD-10-CM | POA: Diagnosis not present

## 2024-03-23 DIAGNOSIS — F0280 Dementia in other diseases classified elsewhere without behavioral disturbance: Secondary | ICD-10-CM | POA: Diagnosis not present

## 2024-03-23 DIAGNOSIS — D693 Immune thrombocytopenic purpura: Secondary | ICD-10-CM | POA: Diagnosis not present

## 2024-03-24 DIAGNOSIS — F0280 Dementia in other diseases classified elsewhere without behavioral disturbance: Secondary | ICD-10-CM | POA: Diagnosis not present

## 2024-03-24 DIAGNOSIS — G309 Alzheimer's disease, unspecified: Secondary | ICD-10-CM | POA: Diagnosis not present

## 2024-03-24 DIAGNOSIS — R32 Unspecified urinary incontinence: Secondary | ICD-10-CM | POA: Diagnosis not present

## 2024-03-24 DIAGNOSIS — N1831 Chronic kidney disease, stage 3a: Secondary | ICD-10-CM | POA: Diagnosis not present

## 2024-03-24 DIAGNOSIS — R5383 Other fatigue: Secondary | ICD-10-CM | POA: Diagnosis not present

## 2024-03-24 DIAGNOSIS — N400 Enlarged prostate without lower urinary tract symptoms: Secondary | ICD-10-CM | POA: Diagnosis not present

## 2024-03-24 DIAGNOSIS — F05 Delirium due to known physiological condition: Secondary | ICD-10-CM | POA: Diagnosis not present

## 2024-03-24 DIAGNOSIS — M5416 Radiculopathy, lumbar region: Secondary | ICD-10-CM | POA: Diagnosis not present

## 2024-03-24 DIAGNOSIS — D693 Immune thrombocytopenic purpura: Secondary | ICD-10-CM | POA: Diagnosis not present

## 2024-03-27 DIAGNOSIS — F0280 Dementia in other diseases classified elsewhere without behavioral disturbance: Secondary | ICD-10-CM | POA: Diagnosis not present

## 2024-03-27 DIAGNOSIS — F05 Delirium due to known physiological condition: Secondary | ICD-10-CM | POA: Diagnosis not present

## 2024-03-27 DIAGNOSIS — R5383 Other fatigue: Secondary | ICD-10-CM | POA: Diagnosis not present

## 2024-03-27 DIAGNOSIS — N1831 Chronic kidney disease, stage 3a: Secondary | ICD-10-CM | POA: Diagnosis not present

## 2024-03-27 DIAGNOSIS — N400 Enlarged prostate without lower urinary tract symptoms: Secondary | ICD-10-CM | POA: Diagnosis not present

## 2024-03-27 DIAGNOSIS — D693 Immune thrombocytopenic purpura: Secondary | ICD-10-CM | POA: Diagnosis not present

## 2024-03-27 DIAGNOSIS — R32 Unspecified urinary incontinence: Secondary | ICD-10-CM | POA: Diagnosis not present

## 2024-03-27 DIAGNOSIS — M5416 Radiculopathy, lumbar region: Secondary | ICD-10-CM | POA: Diagnosis not present

## 2024-03-27 DIAGNOSIS — G309 Alzheimer's disease, unspecified: Secondary | ICD-10-CM | POA: Diagnosis not present

## 2024-03-30 DIAGNOSIS — F0280 Dementia in other diseases classified elsewhere without behavioral disturbance: Secondary | ICD-10-CM | POA: Diagnosis not present

## 2024-03-30 DIAGNOSIS — N400 Enlarged prostate without lower urinary tract symptoms: Secondary | ICD-10-CM | POA: Diagnosis not present

## 2024-03-30 DIAGNOSIS — R5383 Other fatigue: Secondary | ICD-10-CM | POA: Diagnosis not present

## 2024-03-30 DIAGNOSIS — F05 Delirium due to known physiological condition: Secondary | ICD-10-CM | POA: Diagnosis not present

## 2024-03-30 DIAGNOSIS — G309 Alzheimer's disease, unspecified: Secondary | ICD-10-CM | POA: Diagnosis not present

## 2024-03-30 DIAGNOSIS — M5416 Radiculopathy, lumbar region: Secondary | ICD-10-CM | POA: Diagnosis not present

## 2024-03-30 DIAGNOSIS — N1831 Chronic kidney disease, stage 3a: Secondary | ICD-10-CM | POA: Diagnosis not present

## 2024-03-30 DIAGNOSIS — D693 Immune thrombocytopenic purpura: Secondary | ICD-10-CM | POA: Diagnosis not present

## 2024-03-30 DIAGNOSIS — R32 Unspecified urinary incontinence: Secondary | ICD-10-CM | POA: Diagnosis not present

## 2024-03-31 DIAGNOSIS — G309 Alzheimer's disease, unspecified: Secondary | ICD-10-CM | POA: Diagnosis not present

## 2024-03-31 DIAGNOSIS — N1831 Chronic kidney disease, stage 3a: Secondary | ICD-10-CM | POA: Diagnosis not present

## 2024-03-31 DIAGNOSIS — R5383 Other fatigue: Secondary | ICD-10-CM | POA: Diagnosis not present

## 2024-03-31 DIAGNOSIS — M5416 Radiculopathy, lumbar region: Secondary | ICD-10-CM | POA: Diagnosis not present

## 2024-03-31 DIAGNOSIS — R32 Unspecified urinary incontinence: Secondary | ICD-10-CM | POA: Diagnosis not present

## 2024-03-31 DIAGNOSIS — N400 Enlarged prostate without lower urinary tract symptoms: Secondary | ICD-10-CM | POA: Diagnosis not present

## 2024-03-31 DIAGNOSIS — F05 Delirium due to known physiological condition: Secondary | ICD-10-CM | POA: Diagnosis not present

## 2024-03-31 DIAGNOSIS — D693 Immune thrombocytopenic purpura: Secondary | ICD-10-CM | POA: Diagnosis not present

## 2024-03-31 DIAGNOSIS — F0280 Dementia in other diseases classified elsewhere without behavioral disturbance: Secondary | ICD-10-CM | POA: Diagnosis not present

## 2024-04-02 DIAGNOSIS — N1831 Chronic kidney disease, stage 3a: Secondary | ICD-10-CM | POA: Diagnosis not present

## 2024-04-02 DIAGNOSIS — M5416 Radiculopathy, lumbar region: Secondary | ICD-10-CM | POA: Diagnosis not present

## 2024-04-02 DIAGNOSIS — R5383 Other fatigue: Secondary | ICD-10-CM | POA: Diagnosis not present

## 2024-04-02 DIAGNOSIS — F0280 Dementia in other diseases classified elsewhere without behavioral disturbance: Secondary | ICD-10-CM | POA: Diagnosis not present

## 2024-04-02 DIAGNOSIS — N400 Enlarged prostate without lower urinary tract symptoms: Secondary | ICD-10-CM | POA: Diagnosis not present

## 2024-04-02 DIAGNOSIS — G309 Alzheimer's disease, unspecified: Secondary | ICD-10-CM | POA: Diagnosis not present

## 2024-04-02 DIAGNOSIS — R32 Unspecified urinary incontinence: Secondary | ICD-10-CM | POA: Diagnosis not present

## 2024-04-02 DIAGNOSIS — F05 Delirium due to known physiological condition: Secondary | ICD-10-CM | POA: Diagnosis not present

## 2024-04-02 DIAGNOSIS — D693 Immune thrombocytopenic purpura: Secondary | ICD-10-CM | POA: Diagnosis not present

## 2024-04-07 DIAGNOSIS — N1831 Chronic kidney disease, stage 3a: Secondary | ICD-10-CM | POA: Diagnosis not present

## 2024-04-07 DIAGNOSIS — N400 Enlarged prostate without lower urinary tract symptoms: Secondary | ICD-10-CM | POA: Diagnosis not present

## 2024-04-07 DIAGNOSIS — D693 Immune thrombocytopenic purpura: Secondary | ICD-10-CM | POA: Diagnosis not present

## 2024-04-07 DIAGNOSIS — F05 Delirium due to known physiological condition: Secondary | ICD-10-CM | POA: Diagnosis not present

## 2024-04-07 DIAGNOSIS — F0280 Dementia in other diseases classified elsewhere without behavioral disturbance: Secondary | ICD-10-CM | POA: Diagnosis not present

## 2024-04-07 DIAGNOSIS — M5416 Radiculopathy, lumbar region: Secondary | ICD-10-CM | POA: Diagnosis not present

## 2024-04-07 DIAGNOSIS — R32 Unspecified urinary incontinence: Secondary | ICD-10-CM | POA: Diagnosis not present

## 2024-04-07 DIAGNOSIS — R5383 Other fatigue: Secondary | ICD-10-CM | POA: Diagnosis not present

## 2024-04-07 DIAGNOSIS — G309 Alzheimer's disease, unspecified: Secondary | ICD-10-CM | POA: Diagnosis not present

## 2024-04-08 DIAGNOSIS — M5416 Radiculopathy, lumbar region: Secondary | ICD-10-CM | POA: Diagnosis not present

## 2024-04-08 DIAGNOSIS — R5383 Other fatigue: Secondary | ICD-10-CM | POA: Diagnosis not present

## 2024-04-08 DIAGNOSIS — G309 Alzheimer's disease, unspecified: Secondary | ICD-10-CM | POA: Diagnosis not present

## 2024-04-08 DIAGNOSIS — F05 Delirium due to known physiological condition: Secondary | ICD-10-CM | POA: Diagnosis not present

## 2024-04-08 DIAGNOSIS — F0280 Dementia in other diseases classified elsewhere without behavioral disturbance: Secondary | ICD-10-CM | POA: Diagnosis not present

## 2024-04-08 DIAGNOSIS — D693 Immune thrombocytopenic purpura: Secondary | ICD-10-CM | POA: Diagnosis not present

## 2024-04-08 DIAGNOSIS — N400 Enlarged prostate without lower urinary tract symptoms: Secondary | ICD-10-CM | POA: Diagnosis not present

## 2024-04-08 DIAGNOSIS — R32 Unspecified urinary incontinence: Secondary | ICD-10-CM | POA: Diagnosis not present

## 2024-04-08 DIAGNOSIS — N1831 Chronic kidney disease, stage 3a: Secondary | ICD-10-CM | POA: Diagnosis not present

## 2024-04-09 DIAGNOSIS — F0280 Dementia in other diseases classified elsewhere without behavioral disturbance: Secondary | ICD-10-CM | POA: Diagnosis not present

## 2024-04-09 DIAGNOSIS — N1831 Chronic kidney disease, stage 3a: Secondary | ICD-10-CM | POA: Diagnosis not present

## 2024-04-09 DIAGNOSIS — F05 Delirium due to known physiological condition: Secondary | ICD-10-CM | POA: Diagnosis not present

## 2024-04-09 DIAGNOSIS — R32 Unspecified urinary incontinence: Secondary | ICD-10-CM | POA: Diagnosis not present

## 2024-04-09 DIAGNOSIS — M5416 Radiculopathy, lumbar region: Secondary | ICD-10-CM | POA: Diagnosis not present

## 2024-04-09 DIAGNOSIS — G309 Alzheimer's disease, unspecified: Secondary | ICD-10-CM | POA: Diagnosis not present

## 2024-04-09 DIAGNOSIS — R5383 Other fatigue: Secondary | ICD-10-CM | POA: Diagnosis not present

## 2024-04-09 DIAGNOSIS — D693 Immune thrombocytopenic purpura: Secondary | ICD-10-CM | POA: Diagnosis not present

## 2024-04-09 DIAGNOSIS — N400 Enlarged prostate without lower urinary tract symptoms: Secondary | ICD-10-CM | POA: Diagnosis not present

## 2024-04-14 DIAGNOSIS — R5383 Other fatigue: Secondary | ICD-10-CM | POA: Diagnosis not present

## 2024-04-14 DIAGNOSIS — F05 Delirium due to known physiological condition: Secondary | ICD-10-CM | POA: Diagnosis not present

## 2024-04-14 DIAGNOSIS — F0280 Dementia in other diseases classified elsewhere without behavioral disturbance: Secondary | ICD-10-CM | POA: Diagnosis not present

## 2024-04-14 DIAGNOSIS — D693 Immune thrombocytopenic purpura: Secondary | ICD-10-CM | POA: Diagnosis not present

## 2024-04-14 DIAGNOSIS — M5416 Radiculopathy, lumbar region: Secondary | ICD-10-CM | POA: Diagnosis not present

## 2024-04-14 DIAGNOSIS — G309 Alzheimer's disease, unspecified: Secondary | ICD-10-CM | POA: Diagnosis not present

## 2024-04-14 DIAGNOSIS — R32 Unspecified urinary incontinence: Secondary | ICD-10-CM | POA: Diagnosis not present

## 2024-04-14 DIAGNOSIS — N1831 Chronic kidney disease, stage 3a: Secondary | ICD-10-CM | POA: Diagnosis not present

## 2024-04-14 DIAGNOSIS — N400 Enlarged prostate without lower urinary tract symptoms: Secondary | ICD-10-CM | POA: Diagnosis not present

## 2024-04-16 DIAGNOSIS — M5416 Radiculopathy, lumbar region: Secondary | ICD-10-CM | POA: Diagnosis not present

## 2024-04-16 DIAGNOSIS — R32 Unspecified urinary incontinence: Secondary | ICD-10-CM | POA: Diagnosis not present

## 2024-04-16 DIAGNOSIS — F0280 Dementia in other diseases classified elsewhere without behavioral disturbance: Secondary | ICD-10-CM | POA: Diagnosis not present

## 2024-04-16 DIAGNOSIS — F05 Delirium due to known physiological condition: Secondary | ICD-10-CM | POA: Diagnosis not present

## 2024-04-16 DIAGNOSIS — G309 Alzheimer's disease, unspecified: Secondary | ICD-10-CM | POA: Diagnosis not present

## 2024-04-16 DIAGNOSIS — D693 Immune thrombocytopenic purpura: Secondary | ICD-10-CM | POA: Diagnosis not present

## 2024-04-16 DIAGNOSIS — N1831 Chronic kidney disease, stage 3a: Secondary | ICD-10-CM | POA: Diagnosis not present

## 2024-04-16 DIAGNOSIS — N400 Enlarged prostate without lower urinary tract symptoms: Secondary | ICD-10-CM | POA: Diagnosis not present

## 2024-04-16 DIAGNOSIS — R5383 Other fatigue: Secondary | ICD-10-CM | POA: Diagnosis not present

## 2024-04-17 DIAGNOSIS — R5383 Other fatigue: Secondary | ICD-10-CM | POA: Diagnosis not present

## 2024-04-17 DIAGNOSIS — R32 Unspecified urinary incontinence: Secondary | ICD-10-CM | POA: Diagnosis not present

## 2024-04-17 DIAGNOSIS — N400 Enlarged prostate without lower urinary tract symptoms: Secondary | ICD-10-CM | POA: Diagnosis not present

## 2024-04-17 DIAGNOSIS — F05 Delirium due to known physiological condition: Secondary | ICD-10-CM | POA: Diagnosis not present

## 2024-04-17 DIAGNOSIS — G309 Alzheimer's disease, unspecified: Secondary | ICD-10-CM | POA: Diagnosis not present

## 2024-04-17 DIAGNOSIS — D693 Immune thrombocytopenic purpura: Secondary | ICD-10-CM | POA: Diagnosis not present

## 2024-04-17 DIAGNOSIS — N1831 Chronic kidney disease, stage 3a: Secondary | ICD-10-CM | POA: Diagnosis not present

## 2024-04-17 DIAGNOSIS — M5416 Radiculopathy, lumbar region: Secondary | ICD-10-CM | POA: Diagnosis not present

## 2024-04-17 DIAGNOSIS — F0280 Dementia in other diseases classified elsewhere without behavioral disturbance: Secondary | ICD-10-CM | POA: Diagnosis not present

## 2024-04-20 DIAGNOSIS — F05 Delirium due to known physiological condition: Secondary | ICD-10-CM | POA: Diagnosis not present

## 2024-04-20 DIAGNOSIS — F0280 Dementia in other diseases classified elsewhere without behavioral disturbance: Secondary | ICD-10-CM | POA: Diagnosis not present

## 2024-04-20 DIAGNOSIS — N400 Enlarged prostate without lower urinary tract symptoms: Secondary | ICD-10-CM | POA: Diagnosis not present

## 2024-04-20 DIAGNOSIS — N1831 Chronic kidney disease, stage 3a: Secondary | ICD-10-CM | POA: Diagnosis not present

## 2024-04-20 DIAGNOSIS — M5416 Radiculopathy, lumbar region: Secondary | ICD-10-CM | POA: Diagnosis not present

## 2024-04-20 DIAGNOSIS — R32 Unspecified urinary incontinence: Secondary | ICD-10-CM | POA: Diagnosis not present

## 2024-04-20 DIAGNOSIS — D693 Immune thrombocytopenic purpura: Secondary | ICD-10-CM | POA: Diagnosis not present

## 2024-04-20 DIAGNOSIS — R5383 Other fatigue: Secondary | ICD-10-CM | POA: Diagnosis not present

## 2024-04-20 DIAGNOSIS — G309 Alzheimer's disease, unspecified: Secondary | ICD-10-CM | POA: Diagnosis not present

## 2024-04-22 DIAGNOSIS — N400 Enlarged prostate without lower urinary tract symptoms: Secondary | ICD-10-CM | POA: Diagnosis not present

## 2024-04-22 DIAGNOSIS — D693 Immune thrombocytopenic purpura: Secondary | ICD-10-CM | POA: Diagnosis not present

## 2024-04-22 DIAGNOSIS — M5416 Radiculopathy, lumbar region: Secondary | ICD-10-CM | POA: Diagnosis not present

## 2024-04-22 DIAGNOSIS — F0280 Dementia in other diseases classified elsewhere without behavioral disturbance: Secondary | ICD-10-CM | POA: Diagnosis not present

## 2024-04-22 DIAGNOSIS — N1831 Chronic kidney disease, stage 3a: Secondary | ICD-10-CM | POA: Diagnosis not present

## 2024-04-22 DIAGNOSIS — R5383 Other fatigue: Secondary | ICD-10-CM | POA: Diagnosis not present

## 2024-04-22 DIAGNOSIS — F05 Delirium due to known physiological condition: Secondary | ICD-10-CM | POA: Diagnosis not present

## 2024-04-22 DIAGNOSIS — R32 Unspecified urinary incontinence: Secondary | ICD-10-CM | POA: Diagnosis not present

## 2024-04-22 DIAGNOSIS — G309 Alzheimer's disease, unspecified: Secondary | ICD-10-CM | POA: Diagnosis not present

## 2024-04-27 DIAGNOSIS — D693 Immune thrombocytopenic purpura: Secondary | ICD-10-CM | POA: Diagnosis not present

## 2024-04-27 DIAGNOSIS — M5416 Radiculopathy, lumbar region: Secondary | ICD-10-CM | POA: Diagnosis not present

## 2024-04-27 DIAGNOSIS — F0280 Dementia in other diseases classified elsewhere without behavioral disturbance: Secondary | ICD-10-CM | POA: Diagnosis not present

## 2024-04-27 DIAGNOSIS — N400 Enlarged prostate without lower urinary tract symptoms: Secondary | ICD-10-CM | POA: Diagnosis not present

## 2024-04-27 DIAGNOSIS — R5383 Other fatigue: Secondary | ICD-10-CM | POA: Diagnosis not present

## 2024-04-27 DIAGNOSIS — R32 Unspecified urinary incontinence: Secondary | ICD-10-CM | POA: Diagnosis not present

## 2024-04-27 DIAGNOSIS — G309 Alzheimer's disease, unspecified: Secondary | ICD-10-CM | POA: Diagnosis not present

## 2024-04-27 DIAGNOSIS — N1831 Chronic kidney disease, stage 3a: Secondary | ICD-10-CM | POA: Diagnosis not present

## 2024-04-27 DIAGNOSIS — F05 Delirium due to known physiological condition: Secondary | ICD-10-CM | POA: Diagnosis not present

## 2024-04-29 DIAGNOSIS — F0280 Dementia in other diseases classified elsewhere without behavioral disturbance: Secondary | ICD-10-CM | POA: Diagnosis not present

## 2024-04-29 DIAGNOSIS — M5416 Radiculopathy, lumbar region: Secondary | ICD-10-CM | POA: Diagnosis not present

## 2024-04-29 DIAGNOSIS — N1831 Chronic kidney disease, stage 3a: Secondary | ICD-10-CM | POA: Diagnosis not present

## 2024-04-29 DIAGNOSIS — G309 Alzheimer's disease, unspecified: Secondary | ICD-10-CM | POA: Diagnosis not present

## 2024-04-29 DIAGNOSIS — D693 Immune thrombocytopenic purpura: Secondary | ICD-10-CM | POA: Diagnosis not present

## 2024-04-29 DIAGNOSIS — F05 Delirium due to known physiological condition: Secondary | ICD-10-CM | POA: Diagnosis not present

## 2024-04-29 DIAGNOSIS — N400 Enlarged prostate without lower urinary tract symptoms: Secondary | ICD-10-CM | POA: Diagnosis not present

## 2024-04-29 DIAGNOSIS — R32 Unspecified urinary incontinence: Secondary | ICD-10-CM | POA: Diagnosis not present

## 2024-04-29 DIAGNOSIS — R5383 Other fatigue: Secondary | ICD-10-CM | POA: Diagnosis not present

## 2024-05-04 DIAGNOSIS — R5383 Other fatigue: Secondary | ICD-10-CM | POA: Diagnosis not present

## 2024-05-04 DIAGNOSIS — F0280 Dementia in other diseases classified elsewhere without behavioral disturbance: Secondary | ICD-10-CM | POA: Diagnosis not present

## 2024-05-04 DIAGNOSIS — G309 Alzheimer's disease, unspecified: Secondary | ICD-10-CM | POA: Diagnosis not present

## 2024-05-04 DIAGNOSIS — D693 Immune thrombocytopenic purpura: Secondary | ICD-10-CM | POA: Diagnosis not present

## 2024-05-04 DIAGNOSIS — N1831 Chronic kidney disease, stage 3a: Secondary | ICD-10-CM | POA: Diagnosis not present

## 2024-05-04 DIAGNOSIS — F05 Delirium due to known physiological condition: Secondary | ICD-10-CM | POA: Diagnosis not present

## 2024-05-04 DIAGNOSIS — R32 Unspecified urinary incontinence: Secondary | ICD-10-CM | POA: Diagnosis not present

## 2024-05-04 DIAGNOSIS — M5416 Radiculopathy, lumbar region: Secondary | ICD-10-CM | POA: Diagnosis not present

## 2024-05-04 DIAGNOSIS — N400 Enlarged prostate without lower urinary tract symptoms: Secondary | ICD-10-CM | POA: Diagnosis not present

## 2024-05-05 DIAGNOSIS — M5416 Radiculopathy, lumbar region: Secondary | ICD-10-CM | POA: Diagnosis not present

## 2024-05-05 DIAGNOSIS — G309 Alzheimer's disease, unspecified: Secondary | ICD-10-CM | POA: Diagnosis not present

## 2024-05-05 DIAGNOSIS — N400 Enlarged prostate without lower urinary tract symptoms: Secondary | ICD-10-CM | POA: Diagnosis not present

## 2024-05-05 DIAGNOSIS — N1831 Chronic kidney disease, stage 3a: Secondary | ICD-10-CM | POA: Diagnosis not present

## 2024-05-05 DIAGNOSIS — F05 Delirium due to known physiological condition: Secondary | ICD-10-CM | POA: Diagnosis not present

## 2024-05-05 DIAGNOSIS — F0280 Dementia in other diseases classified elsewhere without behavioral disturbance: Secondary | ICD-10-CM | POA: Diagnosis not present

## 2024-05-05 DIAGNOSIS — R32 Unspecified urinary incontinence: Secondary | ICD-10-CM | POA: Diagnosis not present

## 2024-05-05 DIAGNOSIS — R5383 Other fatigue: Secondary | ICD-10-CM | POA: Diagnosis not present

## 2024-05-05 DIAGNOSIS — D693 Immune thrombocytopenic purpura: Secondary | ICD-10-CM | POA: Diagnosis not present

## 2024-05-06 DIAGNOSIS — G309 Alzheimer's disease, unspecified: Secondary | ICD-10-CM | POA: Diagnosis not present

## 2024-05-06 DIAGNOSIS — M5416 Radiculopathy, lumbar region: Secondary | ICD-10-CM | POA: Diagnosis not present

## 2024-05-06 DIAGNOSIS — F0280 Dementia in other diseases classified elsewhere without behavioral disturbance: Secondary | ICD-10-CM | POA: Diagnosis not present

## 2024-05-06 DIAGNOSIS — F05 Delirium due to known physiological condition: Secondary | ICD-10-CM | POA: Diagnosis not present

## 2024-05-06 DIAGNOSIS — N400 Enlarged prostate without lower urinary tract symptoms: Secondary | ICD-10-CM | POA: Diagnosis not present

## 2024-05-06 DIAGNOSIS — R32 Unspecified urinary incontinence: Secondary | ICD-10-CM | POA: Diagnosis not present

## 2024-05-06 DIAGNOSIS — D693 Immune thrombocytopenic purpura: Secondary | ICD-10-CM | POA: Diagnosis not present

## 2024-05-06 DIAGNOSIS — N1831 Chronic kidney disease, stage 3a: Secondary | ICD-10-CM | POA: Diagnosis not present

## 2024-05-06 DIAGNOSIS — R5383 Other fatigue: Secondary | ICD-10-CM | POA: Diagnosis not present

## 2024-05-10 DIAGNOSIS — D693 Immune thrombocytopenic purpura: Secondary | ICD-10-CM | POA: Diagnosis not present

## 2024-05-10 DIAGNOSIS — F05 Delirium due to known physiological condition: Secondary | ICD-10-CM | POA: Diagnosis not present

## 2024-05-10 DIAGNOSIS — F02818 Dementia in other diseases classified elsewhere, unspecified severity, with other behavioral disturbance: Secondary | ICD-10-CM | POA: Diagnosis not present

## 2024-05-10 DIAGNOSIS — R32 Unspecified urinary incontinence: Secondary | ICD-10-CM | POA: Diagnosis not present

## 2024-05-10 DIAGNOSIS — M5416 Radiculopathy, lumbar region: Secondary | ICD-10-CM | POA: Diagnosis not present

## 2024-05-10 DIAGNOSIS — N400 Enlarged prostate without lower urinary tract symptoms: Secondary | ICD-10-CM | POA: Diagnosis not present

## 2024-05-10 DIAGNOSIS — R5383 Other fatigue: Secondary | ICD-10-CM | POA: Diagnosis not present

## 2024-05-10 DIAGNOSIS — N1831 Chronic kidney disease, stage 3a: Secondary | ICD-10-CM | POA: Diagnosis not present

## 2024-05-10 DIAGNOSIS — G309 Alzheimer's disease, unspecified: Secondary | ICD-10-CM | POA: Diagnosis not present

## 2024-05-13 DIAGNOSIS — N400 Enlarged prostate without lower urinary tract symptoms: Secondary | ICD-10-CM | POA: Diagnosis not present

## 2024-05-13 DIAGNOSIS — F05 Delirium due to known physiological condition: Secondary | ICD-10-CM | POA: Diagnosis not present

## 2024-05-13 DIAGNOSIS — R5383 Other fatigue: Secondary | ICD-10-CM | POA: Diagnosis not present

## 2024-05-13 DIAGNOSIS — D693 Immune thrombocytopenic purpura: Secondary | ICD-10-CM | POA: Diagnosis not present

## 2024-05-13 DIAGNOSIS — M5416 Radiculopathy, lumbar region: Secondary | ICD-10-CM | POA: Diagnosis not present

## 2024-05-13 DIAGNOSIS — R32 Unspecified urinary incontinence: Secondary | ICD-10-CM | POA: Diagnosis not present

## 2024-05-13 DIAGNOSIS — G309 Alzheimer's disease, unspecified: Secondary | ICD-10-CM | POA: Diagnosis not present

## 2024-05-13 DIAGNOSIS — F02818 Dementia in other diseases classified elsewhere, unspecified severity, with other behavioral disturbance: Secondary | ICD-10-CM | POA: Diagnosis not present

## 2024-05-13 DIAGNOSIS — N1831 Chronic kidney disease, stage 3a: Secondary | ICD-10-CM | POA: Diagnosis not present

## 2024-05-15 DIAGNOSIS — R32 Unspecified urinary incontinence: Secondary | ICD-10-CM | POA: Diagnosis not present

## 2024-05-15 DIAGNOSIS — F05 Delirium due to known physiological condition: Secondary | ICD-10-CM | POA: Diagnosis not present

## 2024-05-15 DIAGNOSIS — N400 Enlarged prostate without lower urinary tract symptoms: Secondary | ICD-10-CM | POA: Diagnosis not present

## 2024-05-15 DIAGNOSIS — G309 Alzheimer's disease, unspecified: Secondary | ICD-10-CM | POA: Diagnosis not present

## 2024-05-15 DIAGNOSIS — F02818 Dementia in other diseases classified elsewhere, unspecified severity, with other behavioral disturbance: Secondary | ICD-10-CM | POA: Diagnosis not present

## 2024-05-15 DIAGNOSIS — N1831 Chronic kidney disease, stage 3a: Secondary | ICD-10-CM | POA: Diagnosis not present

## 2024-05-15 DIAGNOSIS — M5416 Radiculopathy, lumbar region: Secondary | ICD-10-CM | POA: Diagnosis not present

## 2024-05-15 DIAGNOSIS — R5383 Other fatigue: Secondary | ICD-10-CM | POA: Diagnosis not present

## 2024-05-15 DIAGNOSIS — D693 Immune thrombocytopenic purpura: Secondary | ICD-10-CM | POA: Diagnosis not present

## 2024-05-18 DIAGNOSIS — G309 Alzheimer's disease, unspecified: Secondary | ICD-10-CM | POA: Diagnosis not present

## 2024-05-18 DIAGNOSIS — N400 Enlarged prostate without lower urinary tract symptoms: Secondary | ICD-10-CM | POA: Diagnosis not present

## 2024-05-18 DIAGNOSIS — R5383 Other fatigue: Secondary | ICD-10-CM | POA: Diagnosis not present

## 2024-05-18 DIAGNOSIS — D693 Immune thrombocytopenic purpura: Secondary | ICD-10-CM | POA: Diagnosis not present

## 2024-05-18 DIAGNOSIS — N1831 Chronic kidney disease, stage 3a: Secondary | ICD-10-CM | POA: Diagnosis not present

## 2024-05-18 DIAGNOSIS — R32 Unspecified urinary incontinence: Secondary | ICD-10-CM | POA: Diagnosis not present

## 2024-05-18 DIAGNOSIS — F02818 Dementia in other diseases classified elsewhere, unspecified severity, with other behavioral disturbance: Secondary | ICD-10-CM | POA: Diagnosis not present

## 2024-05-18 DIAGNOSIS — F05 Delirium due to known physiological condition: Secondary | ICD-10-CM | POA: Diagnosis not present

## 2024-05-18 DIAGNOSIS — M5416 Radiculopathy, lumbar region: Secondary | ICD-10-CM | POA: Diagnosis not present

## 2024-05-22 DIAGNOSIS — R5383 Other fatigue: Secondary | ICD-10-CM | POA: Diagnosis not present

## 2024-05-22 DIAGNOSIS — N400 Enlarged prostate without lower urinary tract symptoms: Secondary | ICD-10-CM | POA: Diagnosis not present

## 2024-05-22 DIAGNOSIS — F05 Delirium due to known physiological condition: Secondary | ICD-10-CM | POA: Diagnosis not present

## 2024-05-22 DIAGNOSIS — N1831 Chronic kidney disease, stage 3a: Secondary | ICD-10-CM | POA: Diagnosis not present

## 2024-05-22 DIAGNOSIS — F02818 Dementia in other diseases classified elsewhere, unspecified severity, with other behavioral disturbance: Secondary | ICD-10-CM | POA: Diagnosis not present

## 2024-05-22 DIAGNOSIS — M5416 Radiculopathy, lumbar region: Secondary | ICD-10-CM | POA: Diagnosis not present

## 2024-05-22 DIAGNOSIS — G309 Alzheimer's disease, unspecified: Secondary | ICD-10-CM | POA: Diagnosis not present

## 2024-05-22 DIAGNOSIS — D693 Immune thrombocytopenic purpura: Secondary | ICD-10-CM | POA: Diagnosis not present

## 2024-05-22 DIAGNOSIS — R32 Unspecified urinary incontinence: Secondary | ICD-10-CM | POA: Diagnosis not present

## 2024-05-25 DIAGNOSIS — M5416 Radiculopathy, lumbar region: Secondary | ICD-10-CM | POA: Diagnosis not present

## 2024-05-25 DIAGNOSIS — D693 Immune thrombocytopenic purpura: Secondary | ICD-10-CM | POA: Diagnosis not present

## 2024-05-25 DIAGNOSIS — R5383 Other fatigue: Secondary | ICD-10-CM | POA: Diagnosis not present

## 2024-05-25 DIAGNOSIS — G309 Alzheimer's disease, unspecified: Secondary | ICD-10-CM | POA: Diagnosis not present

## 2024-05-25 DIAGNOSIS — N400 Enlarged prostate without lower urinary tract symptoms: Secondary | ICD-10-CM | POA: Diagnosis not present

## 2024-05-25 DIAGNOSIS — N1831 Chronic kidney disease, stage 3a: Secondary | ICD-10-CM | POA: Diagnosis not present

## 2024-05-25 DIAGNOSIS — R32 Unspecified urinary incontinence: Secondary | ICD-10-CM | POA: Diagnosis not present

## 2024-05-25 DIAGNOSIS — F05 Delirium due to known physiological condition: Secondary | ICD-10-CM | POA: Diagnosis not present

## 2024-05-25 DIAGNOSIS — F02818 Dementia in other diseases classified elsewhere, unspecified severity, with other behavioral disturbance: Secondary | ICD-10-CM | POA: Diagnosis not present

## 2024-05-29 DIAGNOSIS — G309 Alzheimer's disease, unspecified: Secondary | ICD-10-CM | POA: Diagnosis not present

## 2024-05-29 DIAGNOSIS — R5383 Other fatigue: Secondary | ICD-10-CM | POA: Diagnosis not present

## 2024-05-29 DIAGNOSIS — N1831 Chronic kidney disease, stage 3a: Secondary | ICD-10-CM | POA: Diagnosis not present

## 2024-05-29 DIAGNOSIS — N400 Enlarged prostate without lower urinary tract symptoms: Secondary | ICD-10-CM | POA: Diagnosis not present

## 2024-05-29 DIAGNOSIS — R32 Unspecified urinary incontinence: Secondary | ICD-10-CM | POA: Diagnosis not present

## 2024-05-29 DIAGNOSIS — F05 Delirium due to known physiological condition: Secondary | ICD-10-CM | POA: Diagnosis not present

## 2024-05-29 DIAGNOSIS — D693 Immune thrombocytopenic purpura: Secondary | ICD-10-CM | POA: Diagnosis not present

## 2024-05-29 DIAGNOSIS — M5416 Radiculopathy, lumbar region: Secondary | ICD-10-CM | POA: Diagnosis not present

## 2024-05-29 DIAGNOSIS — F02818 Dementia in other diseases classified elsewhere, unspecified severity, with other behavioral disturbance: Secondary | ICD-10-CM | POA: Diagnosis not present

## 2024-06-01 DIAGNOSIS — M5416 Radiculopathy, lumbar region: Secondary | ICD-10-CM | POA: Diagnosis not present

## 2024-06-01 DIAGNOSIS — F05 Delirium due to known physiological condition: Secondary | ICD-10-CM | POA: Diagnosis not present

## 2024-06-01 DIAGNOSIS — D693 Immune thrombocytopenic purpura: Secondary | ICD-10-CM | POA: Diagnosis not present

## 2024-06-01 DIAGNOSIS — G309 Alzheimer's disease, unspecified: Secondary | ICD-10-CM | POA: Diagnosis not present

## 2024-06-01 DIAGNOSIS — R32 Unspecified urinary incontinence: Secondary | ICD-10-CM | POA: Diagnosis not present

## 2024-06-01 DIAGNOSIS — F02818 Dementia in other diseases classified elsewhere, unspecified severity, with other behavioral disturbance: Secondary | ICD-10-CM | POA: Diagnosis not present

## 2024-06-01 DIAGNOSIS — R5383 Other fatigue: Secondary | ICD-10-CM | POA: Diagnosis not present

## 2024-06-01 DIAGNOSIS — N1831 Chronic kidney disease, stage 3a: Secondary | ICD-10-CM | POA: Diagnosis not present

## 2024-06-01 DIAGNOSIS — N400 Enlarged prostate without lower urinary tract symptoms: Secondary | ICD-10-CM | POA: Diagnosis not present

## 2024-06-02 DIAGNOSIS — G309 Alzheimer's disease, unspecified: Secondary | ICD-10-CM | POA: Diagnosis not present

## 2024-06-02 DIAGNOSIS — R32 Unspecified urinary incontinence: Secondary | ICD-10-CM | POA: Diagnosis not present

## 2024-06-02 DIAGNOSIS — M5416 Radiculopathy, lumbar region: Secondary | ICD-10-CM | POA: Diagnosis not present

## 2024-06-02 DIAGNOSIS — N400 Enlarged prostate without lower urinary tract symptoms: Secondary | ICD-10-CM | POA: Diagnosis not present

## 2024-06-02 DIAGNOSIS — D693 Immune thrombocytopenic purpura: Secondary | ICD-10-CM | POA: Diagnosis not present

## 2024-06-02 DIAGNOSIS — N1831 Chronic kidney disease, stage 3a: Secondary | ICD-10-CM | POA: Diagnosis not present

## 2024-06-02 DIAGNOSIS — F05 Delirium due to known physiological condition: Secondary | ICD-10-CM | POA: Diagnosis not present

## 2024-06-02 DIAGNOSIS — R5383 Other fatigue: Secondary | ICD-10-CM | POA: Diagnosis not present

## 2024-06-02 DIAGNOSIS — F02818 Dementia in other diseases classified elsewhere, unspecified severity, with other behavioral disturbance: Secondary | ICD-10-CM | POA: Diagnosis not present

## 2024-06-05 DIAGNOSIS — N400 Enlarged prostate without lower urinary tract symptoms: Secondary | ICD-10-CM | POA: Diagnosis not present

## 2024-06-05 DIAGNOSIS — R5383 Other fatigue: Secondary | ICD-10-CM | POA: Diagnosis not present

## 2024-06-05 DIAGNOSIS — F05 Delirium due to known physiological condition: Secondary | ICD-10-CM | POA: Diagnosis not present

## 2024-06-05 DIAGNOSIS — R32 Unspecified urinary incontinence: Secondary | ICD-10-CM | POA: Diagnosis not present

## 2024-06-05 DIAGNOSIS — M5416 Radiculopathy, lumbar region: Secondary | ICD-10-CM | POA: Diagnosis not present

## 2024-06-05 DIAGNOSIS — G309 Alzheimer's disease, unspecified: Secondary | ICD-10-CM | POA: Diagnosis not present

## 2024-06-05 DIAGNOSIS — N1831 Chronic kidney disease, stage 3a: Secondary | ICD-10-CM | POA: Diagnosis not present

## 2024-06-05 DIAGNOSIS — D693 Immune thrombocytopenic purpura: Secondary | ICD-10-CM | POA: Diagnosis not present

## 2024-06-05 DIAGNOSIS — F02818 Dementia in other diseases classified elsewhere, unspecified severity, with other behavioral disturbance: Secondary | ICD-10-CM | POA: Diagnosis not present

## 2024-06-08 DIAGNOSIS — F02818 Dementia in other diseases classified elsewhere, unspecified severity, with other behavioral disturbance: Secondary | ICD-10-CM | POA: Diagnosis not present

## 2024-06-08 DIAGNOSIS — G309 Alzheimer's disease, unspecified: Secondary | ICD-10-CM | POA: Diagnosis not present

## 2024-06-08 DIAGNOSIS — D693 Immune thrombocytopenic purpura: Secondary | ICD-10-CM | POA: Diagnosis not present

## 2024-06-08 DIAGNOSIS — N1831 Chronic kidney disease, stage 3a: Secondary | ICD-10-CM | POA: Diagnosis not present

## 2024-06-08 DIAGNOSIS — R32 Unspecified urinary incontinence: Secondary | ICD-10-CM | POA: Diagnosis not present

## 2024-06-08 DIAGNOSIS — M5416 Radiculopathy, lumbar region: Secondary | ICD-10-CM | POA: Diagnosis not present

## 2024-06-08 DIAGNOSIS — N400 Enlarged prostate without lower urinary tract symptoms: Secondary | ICD-10-CM | POA: Diagnosis not present

## 2024-06-08 DIAGNOSIS — F05 Delirium due to known physiological condition: Secondary | ICD-10-CM | POA: Diagnosis not present

## 2024-06-08 DIAGNOSIS — R5383 Other fatigue: Secondary | ICD-10-CM | POA: Diagnosis not present

## 2024-06-12 DIAGNOSIS — F05 Delirium due to known physiological condition: Secondary | ICD-10-CM | POA: Diagnosis not present

## 2024-06-12 DIAGNOSIS — R5383 Other fatigue: Secondary | ICD-10-CM | POA: Diagnosis not present

## 2024-06-12 DIAGNOSIS — G309 Alzheimer's disease, unspecified: Secondary | ICD-10-CM | POA: Diagnosis not present

## 2024-06-12 DIAGNOSIS — F02818 Dementia in other diseases classified elsewhere, unspecified severity, with other behavioral disturbance: Secondary | ICD-10-CM | POA: Diagnosis not present

## 2024-06-12 DIAGNOSIS — R32 Unspecified urinary incontinence: Secondary | ICD-10-CM | POA: Diagnosis not present

## 2024-06-12 DIAGNOSIS — M5416 Radiculopathy, lumbar region: Secondary | ICD-10-CM | POA: Diagnosis not present

## 2024-06-12 DIAGNOSIS — N1831 Chronic kidney disease, stage 3a: Secondary | ICD-10-CM | POA: Diagnosis not present

## 2024-06-12 DIAGNOSIS — N400 Enlarged prostate without lower urinary tract symptoms: Secondary | ICD-10-CM | POA: Diagnosis not present

## 2024-06-12 DIAGNOSIS — D693 Immune thrombocytopenic purpura: Secondary | ICD-10-CM | POA: Diagnosis not present

## 2024-06-17 DIAGNOSIS — N400 Enlarged prostate without lower urinary tract symptoms: Secondary | ICD-10-CM | POA: Diagnosis not present

## 2024-06-17 DIAGNOSIS — G309 Alzheimer's disease, unspecified: Secondary | ICD-10-CM | POA: Diagnosis not present

## 2024-06-17 DIAGNOSIS — D693 Immune thrombocytopenic purpura: Secondary | ICD-10-CM | POA: Diagnosis not present

## 2024-06-17 DIAGNOSIS — R5383 Other fatigue: Secondary | ICD-10-CM | POA: Diagnosis not present

## 2024-06-17 DIAGNOSIS — F05 Delirium due to known physiological condition: Secondary | ICD-10-CM | POA: Diagnosis not present

## 2024-06-17 DIAGNOSIS — M5416 Radiculopathy, lumbar region: Secondary | ICD-10-CM | POA: Diagnosis not present

## 2024-06-17 DIAGNOSIS — N1831 Chronic kidney disease, stage 3a: Secondary | ICD-10-CM | POA: Diagnosis not present

## 2024-06-17 DIAGNOSIS — F02818 Dementia in other diseases classified elsewhere, unspecified severity, with other behavioral disturbance: Secondary | ICD-10-CM | POA: Diagnosis not present

## 2024-06-17 DIAGNOSIS — R32 Unspecified urinary incontinence: Secondary | ICD-10-CM | POA: Diagnosis not present

## 2024-06-25 ENCOUNTER — Encounter (HOSPITAL_COMMUNITY): Payer: Self-pay | Admitting: Emergency Medicine

## 2024-06-25 ENCOUNTER — Emergency Department (HOSPITAL_COMMUNITY)
Admission: EM | Admit: 2024-06-25 | Discharge: 2024-06-25 | Disposition: A | Discharge status: Home / Self Care | Source: Ambulatory Visit | Attending: Emergency Medicine | Admitting: Emergency Medicine

## 2024-06-25 ENCOUNTER — Emergency Department (HOSPITAL_COMMUNITY)

## 2024-06-25 ENCOUNTER — Other Ambulatory Visit: Payer: Self-pay

## 2024-06-25 DIAGNOSIS — D693 Immune thrombocytopenic purpura: Secondary | ICD-10-CM | POA: Diagnosis not present

## 2024-06-25 DIAGNOSIS — G309 Alzheimer's disease, unspecified: Secondary | ICD-10-CM | POA: Insufficient documentation

## 2024-06-25 DIAGNOSIS — F0390 Unspecified dementia without behavioral disturbance: Secondary | ICD-10-CM | POA: Insufficient documentation

## 2024-06-25 DIAGNOSIS — R413 Other amnesia: Secondary | ICD-10-CM | POA: Diagnosis not present

## 2024-06-25 DIAGNOSIS — R4182 Altered mental status, unspecified: Secondary | ICD-10-CM | POA: Diagnosis not present

## 2024-06-25 DIAGNOSIS — N400 Enlarged prostate without lower urinary tract symptoms: Secondary | ICD-10-CM | POA: Diagnosis not present

## 2024-06-25 DIAGNOSIS — R32 Unspecified urinary incontinence: Secondary | ICD-10-CM | POA: Insufficient documentation

## 2024-06-25 DIAGNOSIS — R5383 Other fatigue: Secondary | ICD-10-CM | POA: Diagnosis not present

## 2024-06-25 DIAGNOSIS — R350 Frequency of micturition: Secondary | ICD-10-CM | POA: Insufficient documentation

## 2024-06-25 DIAGNOSIS — R531 Weakness: Secondary | ICD-10-CM | POA: Diagnosis not present

## 2024-06-25 DIAGNOSIS — F05 Delirium due to known physiological condition: Secondary | ICD-10-CM | POA: Diagnosis not present

## 2024-06-25 DIAGNOSIS — M5416 Radiculopathy, lumbar region: Secondary | ICD-10-CM | POA: Diagnosis not present

## 2024-06-25 DIAGNOSIS — G9389 Other specified disorders of brain: Secondary | ICD-10-CM | POA: Diagnosis not present

## 2024-06-25 DIAGNOSIS — F02818 Dementia in other diseases classified elsewhere, unspecified severity, with other behavioral disturbance: Secondary | ICD-10-CM | POA: Diagnosis not present

## 2024-06-25 DIAGNOSIS — I6782 Cerebral ischemia: Secondary | ICD-10-CM | POA: Diagnosis not present

## 2024-06-25 DIAGNOSIS — N1831 Chronic kidney disease, stage 3a: Secondary | ICD-10-CM | POA: Diagnosis not present

## 2024-06-25 LAB — URINALYSIS, W/ REFLEX TO CULTURE (INFECTION SUSPECTED)
Bacteria, UA: NONE SEEN
Bilirubin Urine: NEGATIVE
Glucose, UA: NEGATIVE mg/dL
Hgb urine dipstick: NEGATIVE
Ketones, ur: 5 mg/dL — AB
Leukocytes,Ua: NEGATIVE
Nitrite: NEGATIVE
Protein, ur: NEGATIVE mg/dL
Specific Gravity, Urine: 1.023 (ref 1.005–1.030)
pH: 5 (ref 5.0–8.0)

## 2024-06-25 LAB — CBC WITH DIFFERENTIAL/PLATELET
Abs Immature Granulocytes: 0.01 K/uL (ref 0.00–0.07)
Basophils Absolute: 0 K/uL (ref 0.0–0.1)
Basophils Relative: 1 %
Eosinophils Absolute: 0.4 K/uL (ref 0.0–0.5)
Eosinophils Relative: 6 %
HCT: 47.2 % (ref 39.0–52.0)
Hemoglobin: 15.4 g/dL (ref 13.0–17.0)
Immature Granulocytes: 0 %
Lymphocytes Relative: 35 %
Lymphs Abs: 2.1 K/uL (ref 0.7–4.0)
MCH: 31.8 pg (ref 26.0–34.0)
MCHC: 32.6 g/dL (ref 30.0–36.0)
MCV: 97.3 fL (ref 80.0–100.0)
Monocytes Absolute: 0.6 K/uL (ref 0.1–1.0)
Monocytes Relative: 9 %
Neutro Abs: 2.9 K/uL (ref 1.7–7.7)
Neutrophils Relative %: 49 %
Platelets: 146 K/uL — ABNORMAL LOW (ref 150–400)
RBC: 4.85 MIL/uL (ref 4.22–5.81)
RDW: 12.7 % (ref 11.5–15.5)
WBC: 5.9 K/uL (ref 4.0–10.5)
nRBC: 0 % (ref 0.0–0.2)

## 2024-06-25 LAB — COMPREHENSIVE METABOLIC PANEL WITH GFR
ALT: 18 U/L (ref 0–44)
AST: 19 U/L (ref 15–41)
Albumin: 3.2 g/dL — ABNORMAL LOW (ref 3.5–5.0)
Alkaline Phosphatase: 59 U/L (ref 38–126)
Anion gap: 7 (ref 5–15)
BUN: 14 mg/dL (ref 8–23)
CO2: 27 mmol/L (ref 22–32)
Calcium: 8.8 mg/dL — ABNORMAL LOW (ref 8.9–10.3)
Chloride: 107 mmol/L (ref 98–111)
Creatinine, Ser: 1.11 mg/dL (ref 0.61–1.24)
GFR, Estimated: >60 mL/min (ref >60)
Glucose, Bld: 92 mg/dL (ref 70–99)
Potassium: 4 mmol/L (ref 3.5–5.1)
Sodium: 141 mmol/L (ref 135–145)
Total Bilirubin: 0.8 mg/dL (ref 0.0–1.2)
Total Protein: 7.6 g/dL (ref 6.5–8.1)

## 2024-06-25 LAB — BLOOD GAS, VENOUS
Acid-Base Excess: 2.4 mmol/L — ABNORMAL HIGH (ref 0.0–2.0)
Bicarbonate: 27.2 mmol/L (ref 20.0–28.0)
O2 Saturation: 75.8 %
Patient temperature: 37
pCO2, Ven: 42 mmHg — ABNORMAL LOW (ref 44–60)
pH, Ven: 7.42 (ref 7.25–7.43)
pO2, Ven: 41 mmHg (ref 32–45)

## 2024-06-25 LAB — AMMONIA: Ammonia: 23 umol/L (ref 9–35)

## 2024-06-25 MED ORDER — SODIUM CHLORIDE 0.9 % IV BOLUS
500.0000 mL | Freq: Once | INTRAVENOUS | Status: AC
  Administered 2024-06-25: 500 mL via INTRAVENOUS

## 2024-06-25 NOTE — ED Notes (Signed)
 Xray at [facility]

## 2024-06-25 NOTE — ED Triage Notes (Signed)
 Pt presents with son with increasing weakness and inability to stand on his own. Son states urine has a strong smell but has not been able to obtain sample for his physician's office d/t incontinence. Were told to come to the ED for catheter sample.

## 2024-06-25 NOTE — ED Provider Notes (Signed)
 [place] EMERGENCY DEPARTMENT AT [facility] Provider Note   CSN: [id number] Arrival date & time: 06/25/24  1656     Patient presents with: Weakness and Urinary Frequency   Robert Hester is a 78 y.o. male.    Weakness Associated symptoms: frequency   Urinary Frequency     Patient has a history of Alzheimer's dementia urinary incontinence.  History is provided primarily by the patient's son.  They indicate over the last few days he has had some increasing weakness.  They have noticed a strong urine odor recently and was concerned about possible UTI.  Patient normally does not walk much and needs to use an assist chair to transfer.  He can use a rolling walker somewhat.  Has not had any recent falls.  No fevers.  No vomiting or diarrhea.  No chest pain or shortness of breath.  Prior to Admission medications   Medication Sig Start Date End Date Taking? Authorizing Provider  donepezil  (ARICEPT ) 10 MG tablet TAKE 1 TABLET(10 MG) BY MOUTH AT BEDTIME 01/24/24   Jaffe, Adam R, DO  LORazepam  (ATIVAN ) 1 MG tablet Take 1 tablet (1 mg total) by mouth at bedtime as needed for anxiety. 01/24/24   Skeet Juliene SAUNDERS, DO  memantine  (NAMENDA ) 10 MG tablet Take 1 tablet (10 mg total) by mouth 2 (two) times daily. 01/24/24   Skeet Juliene SAUNDERS, DO  mirabegron ER (MYRBETRIQ) 50 MG TB24 tablet Take 50 mg by mouth daily. 01/06/24   [provider]  Omega-3 Fatty Acids (FISH OIL) 1000 MG CAPS Take by mouth.    [provider]    Allergies: Penicillins    Review of Systems  Genitourinary:  Positive for frequency.  Neurological:  Positive for weakness.    Updated Vital Signs BP 108/71   Pulse (!) 59   Temp (!) 97.2 F (36.2 C) (Oral)   Resp 20   SpO2 94%   Physical Exam Vitals and nursing note reviewed.  Constitutional:      General: He is not in acute distress.    Appearance: He is well-developed.  HENT:     Head: Normocephalic and atraumatic.     Right Ear: External ear  normal.     Left Ear: External ear normal.  Eyes:     General: No scleral icterus.       Right eye: No discharge.        Left eye: No discharge.     Conjunctiva/sclera: Conjunctivae normal.  Neck:     Trachea: No tracheal deviation.  Cardiovascular:     Rate and Rhythm: Normal rate and regular rhythm.  Pulmonary:     Effort: Pulmonary effort is normal. No respiratory distress.     Breath sounds: Normal breath sounds. No stridor. No wheezing or rales.  Abdominal:     General: Bowel sounds are normal. There is no distension.     Palpations: Abdomen is soft.     Tenderness: There is no abdominal tenderness. There is no guarding or rebound.  Musculoskeletal:        General: No tenderness or deformity.     Cervical back: Neck supple.  Skin:    General: Skin is warm and dry.     Findings: No rash.  Neurological:     General: No focal deficit present.     Mental Status: He is alert.     Cranial Nerves: No cranial nerve deficit, dysarthria or facial asymmetry.     Sensory: No  sensory deficit.     Motor: Weakness present. No abnormal muscle tone or seizure activity.     Coordination: Coordination normal.     Comments: General weakness although patient will lift both legs off the bed, and lift his arms without difficulty  Psychiatric:        Mood and Affect: Mood normal.     (all labs ordered are listed, but only abnormal results are displayed) Labs Reviewed  COMPREHENSIVE METABOLIC PANEL WITH GFR - Abnormal; Notable for the following components:      Result Value   Calcium 8.8 (*)    Albumin 3.2 (*)    All other components within normal limits  CBC WITH DIFFERENTIAL/PLATELET - Abnormal; Notable for the following components:   Platelets 146 (*)    All other components within normal limits  BLOOD GAS, VENOUS - Abnormal; Notable for the following components:   pCO2, Ven 42 (*)    Acid-Base Excess 2.4 (*)    All other components within normal limits  URINALYSIS, W/ REFLEX TO  CULTURE (INFECTION SUSPECTED) - Abnormal; Notable for the following components:   Ketones, ur 5 (*)    All other components within normal limits  AMMONIA    EKG: EKG Interpretation Date/Time:  Thursday June 25 2024 17:36:26 EDT Ventricular Rate:  59 PR Interval:  147 QRS Duration:  108 QT Interval:  420 QTC Calculation: 416 R Axis:   -22  Text Interpretation: Sinus rhythm Atrial premature complex Borderline left axis deviation Low voltage, precordial leads Abnormal R-wave progression, early transition No significant change since last tracing Confirmed by Randol Simmonds [phone]) on 06/25/2024 5:42:46 PM  Radiology: CT HEAD WO CONTRAST Result Date: 06/25/2024 CLINICAL DATA:  Mental status change EXAM: CT HEAD WITHOUT CONTRAST TECHNIQUE: Contiguous axial images were obtained from the base of the skull through the vertex without intravenous contrast. RADIATION DOSE REDUCTION: This exam was performed according to the departmental dose-optimization program which includes automated exposure control, adjustment of the mA and/or kV according to patient size and/or use of iterative reconstruction technique. COMPARISON:  MRI 07/20/2023, CT brain 08/11/2010 FINDINGS: Brain: No acute territorial infarction, hemorrhage or intracranial mass. Moderate advanced atrophy. Mild periventricular white matter hypodensity probably due to chronic small vessel ischemic change. The ventricles are enlarged but stable, probably atrophy related Vascular: No hyperdense vessels.  Carotid vascular calcification Skull: Normal. Negative for fracture or focal lesion. Sinuses/Orbits: Moderate mucosal thickening in the sinuses Other: None IMPRESSION: 1. No CT evidence for acute intracranial abnormality. 2. Atrophy and chronic small vessel ischemic changes of the white matter. 3. Moderate mucosal thickening in the sinuses. Electronically Signed   By: Luke Bun M.D.   On: 06/25/2024 18:34   DG Chest Port 1 View Result Date:  06/25/2024 CLINICAL DATA:  Weakness. EXAM: PORTABLE CHEST 1 VIEW COMPARISON:  July 20, 2023. FINDINGS: The heart size and mediastinal contours are within normal limits. Both lungs are clear. The visualized skeletal structures are unremarkable. IMPRESSION: No active disease. Electronically Signed   By: Lynwood Landy Raddle M.D.   On: 06/25/2024 18:19     Procedures   Medications Ordered in the ED  sodium chloride  0.9 % bolus 500 mL (0 mLs Intravenous Stopped 06/25/24 1940)    Clinical Course as of 06/25/24 2028  Thu Jun 25, 2024  1820 CBC WITH DIFFERENTIAL(!) Normal. [JK]  1820 Blood gas, venous(!) No significant abnormalities [JK]    Clinical Course User Index [JK] Randol Simmonds, MD  Medical Decision Making Amount and/or Complexity of Data Reviewed Labs: ordered. Decision-making details documented in ED Course. Radiology: ordered.   Patient presented to the ED for evaluation of weakness questional UTI.  Patient is ED withIs reassuring.  There is no signs of systemic infection.  Patient does not have any hypotension.  He does not have any leukocytosis.  No tachycardia.  Labs do not show any signs of acute electrolyte abnormalities.  No anemia.  No UTI.  Head CT does not show any acute findings.  No focal symptoms to suggest acute stroke.  Gust the results with the patient and patient's son.  Possible patient may have symptoms of viral illness.  They are comfortable with discharge and close outpatient follow-up.     Final diagnoses:  Weakness  Memory loss    ED Discharge Orders     None          Randol Simmonds, MD 06/25/24 2028

## 2024-06-25 NOTE — Discharge Instructions (Addendum)
 The test in the ED did not show any signs of serious infection.  Follow-up with his doctor next week to be rechecked if the symptoms have not resolved.  Return to the ED as needed for worsening symptoms.

## 2024-07-01 DIAGNOSIS — F02818 Dementia in other diseases classified elsewhere, unspecified severity, with other behavioral disturbance: Secondary | ICD-10-CM | POA: Diagnosis not present

## 2024-07-01 DIAGNOSIS — N1831 Chronic kidney disease, stage 3a: Secondary | ICD-10-CM | POA: Diagnosis not present

## 2024-07-01 DIAGNOSIS — R32 Unspecified urinary incontinence: Secondary | ICD-10-CM | POA: Diagnosis not present

## 2024-07-01 DIAGNOSIS — F05 Delirium due to known physiological condition: Secondary | ICD-10-CM | POA: Diagnosis not present

## 2024-07-01 DIAGNOSIS — M5416 Radiculopathy, lumbar region: Secondary | ICD-10-CM | POA: Diagnosis not present

## 2024-07-01 DIAGNOSIS — N400 Enlarged prostate without lower urinary tract symptoms: Secondary | ICD-10-CM | POA: Diagnosis not present

## 2024-07-01 DIAGNOSIS — G309 Alzheimer's disease, unspecified: Secondary | ICD-10-CM | POA: Diagnosis not present

## 2024-07-01 DIAGNOSIS — D693 Immune thrombocytopenic purpura: Secondary | ICD-10-CM | POA: Diagnosis not present

## 2024-07-01 DIAGNOSIS — R5383 Other fatigue: Secondary | ICD-10-CM | POA: Diagnosis not present

## 2024-07-02 DIAGNOSIS — I739 Peripheral vascular disease, unspecified: Secondary | ICD-10-CM | POA: Diagnosis not present

## 2024-07-02 DIAGNOSIS — E1151 Type 2 diabetes mellitus with diabetic peripheral angiopathy without gangrene: Secondary | ICD-10-CM | POA: Diagnosis not present

## 2024-07-02 DIAGNOSIS — M21962 Unspecified acquired deformity of left lower leg: Secondary | ICD-10-CM | POA: Diagnosis not present

## 2024-07-02 DIAGNOSIS — B351 Tinea unguium: Secondary | ICD-10-CM | POA: Diagnosis not present

## 2024-07-02 DIAGNOSIS — M792 Neuralgia and neuritis, unspecified: Secondary | ICD-10-CM | POA: Diagnosis not present

## 2024-07-02 DIAGNOSIS — M21961 Unspecified acquired deformity of right lower leg: Secondary | ICD-10-CM | POA: Diagnosis not present

## 2024-07-13 DIAGNOSIS — R3915 Urgency of urination: Secondary | ICD-10-CM | POA: Diagnosis not present

## 2024-10-07 ENCOUNTER — Telehealth: Payer: Self-pay | Admitting: Neurology

## 2024-10-07 NOTE — Telephone Encounter (Signed)
 Patient wife advised of Dr.Jaffe note, If no medication changes are expected, the providers with hospice may continue refills and he may follow up as needed.

## 2024-10-07 NOTE — Telephone Encounter (Signed)
 the patient wife is not sure if he would still need to see Dr Skeet  they now have Hospice involved. We called to resch the appt for 11-02-24 due to a schedule change with Dr Skeet schedule.   Please call

## 2024-10-26 ENCOUNTER — Ambulatory Visit: Payer: Medicare PPO | Admitting: Neurology

## 2024-11-02 ENCOUNTER — Ambulatory Visit: Admitting: Neurology

## 2024-11-05 ENCOUNTER — Emergency Department (HOSPITAL_COMMUNITY)

## 2024-11-05 ENCOUNTER — Observation Stay (HOSPITAL_COMMUNITY)
Admission: EM | Admit: 2024-11-05 | Discharge: 2024-11-06 | Disposition: A | Discharge status: Hospice | Attending: Emergency Medicine | Admitting: Emergency Medicine

## 2024-11-05 DIAGNOSIS — I214 Non-ST elevation (NSTEMI) myocardial infarction: Principal | ICD-10-CM | POA: Insufficient documentation

## 2024-11-05 DIAGNOSIS — R079 Chest pain, unspecified: Secondary | ICD-10-CM | POA: Diagnosis present

## 2024-11-05 DIAGNOSIS — R339 Retention of urine, unspecified: Secondary | ICD-10-CM | POA: Diagnosis not present

## 2024-11-05 DIAGNOSIS — K219 Gastro-esophageal reflux disease without esophagitis: Secondary | ICD-10-CM | POA: Diagnosis not present

## 2024-11-05 DIAGNOSIS — R053 Chronic cough: Secondary | ICD-10-CM | POA: Insufficient documentation

## 2024-11-05 DIAGNOSIS — F1290 Cannabis use, unspecified, uncomplicated: Secondary | ICD-10-CM | POA: Insufficient documentation

## 2024-11-05 LAB — URINALYSIS, ROUTINE W REFLEX MICROSCOPIC
Bilirubin Urine: NEGATIVE
Glucose, UA: NEGATIVE mg/dL
Hgb urine dipstick: NEGATIVE
Ketones, ur: 5 mg/dL — AB
Leukocytes,Ua: NEGATIVE
Nitrite: NEGATIVE
Protein, ur: 100 mg/dL — AB
Specific Gravity, Urine: 1.025 (ref 1.005–1.030)
pH: 5 (ref 5.0–8.0)

## 2024-11-05 LAB — CBC WITH DIFFERENTIAL/PLATELET
Abs Immature Granulocytes: 0.03 K/uL (ref 0.00–0.07)
Basophils Absolute: 0 K/uL (ref 0.0–0.1)
Basophils Relative: 0 %
Eosinophils Absolute: 0.1 K/uL (ref 0.0–0.5)
Eosinophils Relative: 1 %
HCT: 46.5 % (ref 39.0–52.0)
Hemoglobin: 15.3 g/dL (ref 13.0–17.0)
Immature Granulocytes: 0 %
Lymphocytes Relative: 11 %
Lymphs Abs: 1 K/uL (ref 0.7–4.0)
MCH: 32 pg (ref 26.0–34.0)
MCHC: 32.9 g/dL (ref 30.0–36.0)
MCV: 97.3 fL (ref 80.0–100.0)
Monocytes Absolute: 0.5 K/uL (ref 0.1–1.0)
Monocytes Relative: 6 %
Neutro Abs: 7 K/uL (ref 1.7–7.7)
Neutrophils Relative %: 82 %
Platelets: 162 K/uL (ref 150–400)
RBC: 4.78 MIL/uL (ref 4.22–5.81)
RDW: 13 % (ref 11.5–15.5)
WBC: 8.7 K/uL (ref 4.0–10.5)
nRBC: 0 % (ref 0.0–0.2)

## 2024-11-05 LAB — I-STAT CG4 LACTIC ACID, ED
Lactic Acid, Venous: 2.7 mmol/L (ref 0.5–1.9)
Lactic Acid, Venous: 3.4 mmol/L (ref 0.5–1.9)

## 2024-11-05 LAB — RESP PANEL BY RT-PCR (RSV, FLU A&B, COVID)  RVPGX2
Influenza A by PCR: NEGATIVE
Influenza B by PCR: NEGATIVE
Resp Syncytial Virus by PCR: NEGATIVE
SARS Coronavirus 2 by RT PCR: NEGATIVE

## 2024-11-05 LAB — BASIC METABOLIC PANEL WITH GFR
Anion gap: 12 (ref 5–15)
BUN: 14 mg/dL (ref 8–23)
CO2: 22 mmol/L (ref 22–32)
Calcium: 8.6 mg/dL — ABNORMAL LOW (ref 8.9–10.3)
Chloride: 112 mmol/L — ABNORMAL HIGH (ref 98–111)
Creatinine, Ser: 1.08 mg/dL (ref 0.61–1.24)
GFR, Estimated: >60 mL/min (ref >60)
Glucose, Bld: 76 mg/dL (ref 70–99)
Potassium: 3.4 mmol/L — ABNORMAL LOW (ref 3.5–5.1)
Sodium: 145 mmol/L (ref 135–145)

## 2024-11-05 LAB — TROPONIN T, HIGH SENSITIVITY
Troponin T High Sensitivity: 166 ng/L (ref 0–19)
Troponin T High Sensitivity: 307 ng/L (ref 0–19)

## 2024-11-05 LAB — PRO BRAIN NATRIURETIC PEPTIDE: Pro Brain Natriuretic Peptide: 71.6 pg/mL (ref <300.0)

## 2024-11-05 MED ORDER — HEPARIN (PORCINE) 25000 UT/250ML-% IV SOLN
1000.0000 [IU]/h | INTRAVENOUS | Status: DC
  Administered 2024-11-05: 1000 [IU]/h via INTRAVENOUS
  Filled 2024-11-05: qty 250

## 2024-11-05 MED ORDER — IOHEXOL 350 MG/ML SOLN
75.0000 mL | Freq: Once | INTRAVENOUS | Status: AC | PRN
  Administered 2024-11-05: 75 mL via INTRAVENOUS

## 2024-11-05 MED ORDER — PANTOPRAZOLE SODIUM 40 MG PO TBEC
40.0000 mg | DELAYED_RELEASE_TABLET | Freq: Every day | ORAL | Status: DC
  Administered 2024-11-06: 40 mg via ORAL
  Filled 2024-11-05: qty 1

## 2024-11-05 MED ORDER — OXYBUTYNIN CHLORIDE 5 MG PO TABS
5.0000 mg | ORAL_TABLET | Freq: Two times a day (BID) | ORAL | Status: DC
  Administered 2024-11-05 – 2024-11-06 (×2): 5 mg via ORAL
  Filled 2024-11-05 (×2): qty 1

## 2024-11-05 MED ORDER — ONDANSETRON HCL 4 MG/2ML IJ SOLN: 4.0000 mg | Freq: Four times a day (QID) | INTRAMUSCULAR | Status: DC | PRN

## 2024-11-05 MED ORDER — LACTATED RINGERS IV BOLUS
1000.0000 mL | Freq: Once | INTRAVENOUS | Status: AC
  Administered 2024-11-05: 1000 mL via INTRAVENOUS

## 2024-11-05 MED ORDER — FENTANYL CITRATE (PF) 50 MCG/ML IJ SOSY: 12.5000 ug | PREFILLED_SYRINGE | INTRAMUSCULAR | Status: DC | PRN

## 2024-11-05 MED ORDER — DONEPEZIL HCL 10 MG PO TABS
10.0000 mg | ORAL_TABLET | Freq: Every day | ORAL | Status: DC
  Administered 2024-11-05: 10 mg via ORAL
  Filled 2024-11-05: qty 2

## 2024-11-05 MED ORDER — ONDANSETRON HCL 4 MG PO TABS: 4.0000 mg | ORAL_TABLET | Freq: Four times a day (QID) | ORAL | Status: DC | PRN

## 2024-11-05 MED ORDER — HEPARIN BOLUS VIA INFUSION
4000.0000 [IU] | Freq: Once | INTRAVENOUS | Status: AC
  Administered 2024-11-05: 4000 [IU] via INTRAVENOUS
  Filled 2024-11-05: qty 4000

## 2024-11-05 MED ORDER — LORAZEPAM 1 MG PO TABS: 1.0000 mg | ORAL_TABLET | ORAL | Status: DC | PRN

## 2024-11-05 NOTE — ED Provider Notes (Signed)
 [place] EMERGENCY DEPARTMENT AT [facility] Provider Note   CSN: [id number] Arrival date & time: 11/05/24  1455     Patient presents with: Weakness   Robert Hester is a 78 y.o. male with history of dementia, on home hospice, presented to ED with concern for aspiration event witnessed by family members when he was out in his wheelchair today.  EMS was called as the patient appeared to have difficulty breathing.  Patient is pleasant demented on arrival   HPI     Prior to Admission medications   Medication Sig Start Date End Date Taking? Authorizing Provider  acetaminophen (TYLENOL) 500 MG tablet Take 500 mg by mouth in the morning.   Yes [provider]  albuterol (PROVENTIL) (2.5 MG/3ML) 0.083% nebulizer solution Take 2.5 mg by nebulization every 6 (six) hours as needed for wheezing or shortness of breath.   Yes [provider]  donepezil  (ARICEPT ) 10 MG tablet TAKE 1 TABLET(10 MG) BY MOUTH AT BEDTIME 01/24/24  Yes Jaffe, Adam R, DO  LORazepam  (ATIVAN ) 1 MG tablet Take 1 tablet (1 mg total) by mouth at bedtime as needed for anxiety. Patient taking differently: Take 1 mg by mouth every 4 (four) hours as needed for anxiety. 01/24/24  Yes Jaffe, Adam R, DO  oxybutynin (DITROPAN) 5 MG tablet Take 5 mg by mouth in the morning and at bedtime.   Yes [provider]  pantoprazole (PROTONIX) 40 MG tablet Take 40 mg by mouth daily before breakfast.   Yes [provider]  senna (SENOKOT) 8.6 MG TABS tablet Take 2 tablets by mouth in the morning and at bedtime.   Yes [provider]  memantine  (NAMENDA ) 10 MG tablet Take 1 tablet (10 mg total) by mouth 2 (two) times daily. Patient not taking: Reported on 11/05/2024 01/24/24   Skeet Juliene SAUNDERS, DO    Allergies: Penicillins    Review of Systems  Updated Vital Signs BP (!) 115/58   Pulse 62   Temp 98.3 F (36.8 C) (Oral)   Resp 20   Ht 5' 8 (1.727 m)   Wt 86.4 kg   SpO2 93%   BMI  28.96 kg/m   Physical Exam Constitutional:      General: He is not in acute distress. HENT:     Head: Normocephalic and atraumatic.  Eyes:     Conjunctiva/sclera: Conjunctivae normal.     Pupils: Pupils are equal, round, and reactive to light.  Cardiovascular:     Rate and Rhythm: Normal rate and regular rhythm.  Pulmonary:     Effort: Pulmonary effort is normal. No respiratory distress.     Comments: Rhonchorous breath sounds in the lower lobes, no respiratory distress, 92% on room air Abdominal:     General: There is no distension.     Tenderness: There is no abdominal tenderness.  Skin:    General: Skin is warm and dry.  Neurological:     General: No focal deficit present.     Mental Status: He is alert. Mental status is at baseline.  Psychiatric:        Mood and Affect: Mood normal.        Behavior: Behavior normal.     (all labs ordered are listed, but only abnormal results are displayed) Labs Reviewed  BASIC METABOLIC PANEL WITH GFR - Abnormal; Notable for the following components:      Result Value   Potassium 3.4 (*)    Chloride 112 (*)  Calcium 8.6 (*)    All other components within normal limits  URINALYSIS, ROUTINE W REFLEX MICROSCOPIC - Abnormal; Notable for the following components:   Color, Urine AMBER (*)    APPearance HAZY (*)    Ketones, ur 5 (*)    Protein, ur 100 (*)    Bacteria, UA RARE (*)    All other components within normal limits  I-STAT CG4 LACTIC ACID, ED - Abnormal; Notable for the following components:   Lactic Acid, Venous 2.7 (*)    All other components within normal limits  I-STAT CG4 LACTIC ACID, ED - Abnormal; Notable for the following components:   Lactic Acid, Venous 3.4 (*)    All other components within normal limits  TROPONIN T, HIGH SENSITIVITY - Abnormal; Notable for the following components:   Troponin T High Sensitivity 166 (*)    All other components within normal limits  TROPONIN T, HIGH SENSITIVITY - Abnormal;  Notable for the following components:   Troponin T High Sensitivity 307 (*)    All other components within normal limits  RESP PANEL BY RT-PCR (RSV, FLU A&B, COVID)  RVPGX2  PRO BRAIN NATRIURETIC PEPTIDE  CBC WITH DIFFERENTIAL/PLATELET  CBC  HEPARIN LEVEL (UNFRACTIONATED)    EKG: EKG Interpretation Date/Time:  Thursday November 05 2024 18:54:42 EST Ventricular Rate:  66 PR Interval:  161 QRS Duration:  121 QT Interval:  394 QTC Calculation: 413 R Axis:   -58  Text Interpretation: Sinus rhythm Nonspecific IVCD with LAD Consider anterior infarct Confirmed by Cottie Cough [phone]) on 11/05/2024 6:56:24 PM  Radiology: CT Angio Chest PE W and/or Wo Contrast Result Date: 11/05/2024 CLINICAL DATA:  Evaluate for pulmonary embolism. EXAM: CT ANGIOGRAPHY CHEST WITH CONTRAST TECHNIQUE: Multidetector CT imaging of the chest was performed using the standard protocol during bolus administration of intravenous contrast. Multiplanar CT image reconstructions and MIPs were obtained to evaluate the vascular anatomy. RADIATION DOSE REDUCTION: This exam was performed according to the departmental dose-optimization program which includes automated exposure control, adjustment of the mA and/or kV according to patient size and/or use of iterative reconstruction technique. CONTRAST:  75mL OMNIPAQUE IOHEXOL 350 MG/ML SOLN COMPARISON:  None Available. FINDINGS: Cardiovascular: Satisfactory opacification of the pulmonary arteries to the segmental level. No evidence of pulmonary embolism. Normal heart size with mild coronary artery calcification. No pericardial effusion. Mediastinum/Nodes: No enlarged mediastinal, hilar, or axillary lymph nodes. Thyroid  gland, trachea, and esophagus demonstrate no significant findings. Lungs/Pleura: Mild atelectatic changes are seen along the posterior aspects of the bilateral upper lobes and bilateral lower lobes. No acute infiltrate, pleural effusion or pneumothorax is identified.  Upper Abdomen: A 3.5 cm x 2.6 cm ill-defined area of parenchymal low attenuation is seen within the posterior aspect of the liver dome (approximately 2.47 Hounsfield units). Similar appearing 1.1 cm x 1.1 cm and 2.8 cm x 1.9 cm areas of parenchymal low attenuation is noted within the posterior aspect of the right lobe of the liver. Musculoskeletal: No chest wall abnormality. No acute or significant osseous findings. Review of the MIP images confirms the above findings. IMPRESSION: 1. No evidence of pulmonary embolism or other acute intrathoracic process. 2. Multiple ill-defined areas of parenchymal low attenuation within the liver, likely representing hepatic cysts versus hemangiomas. Correlation with nonemergent hepatic ultrasound is recommended. Electronically Signed   By: Suzen Dials M.D.   On: 11/05/2024 19:03   DG Chest 2 View Result Date: 11/05/2024 EXAM: 2 VIEW(S) XRAY OF THE CHEST 11/05/2024 05:26:00 PM COMPARISON: 06/25/2024 CLINICAL HISTORY:  cough FINDINGS: LUNGS AND PLEURA: Low lung volumes with crowding of the vasculature. No focal pulmonary opacity. No pleural effusion. No pneumothorax. HEART AND MEDIASTINUM: No acute abnormality of the cardiac and mediastinal silhouettes. BONES AND SOFT TISSUES: Multilevel degenerative changes of thoracic spine. IMPRESSION: 1. Low lung volumes with vascular crowding. Electronically signed by: Morgane Naveau MD 11/05/2024 05:33 PM EST RP Workstation: [id number]     Procedures   Medications Ordered in the ED  lactated ringers  bolus 1,000 mL (0 mLs Intravenous Stopped 11/05/24 1939)  iohexol  (OMNIPAQUE ) 350 MG/ML injection 75 mL (75 mLs Intravenous Contrast Given 11/05/24 1828)  heparin  bolus via infusion 4,000 Units (4,000 Units Intravenous Bolus from Bag 11/05/24 2054)    Clinical Course as of 11/05/24 2205  Thu Nov 05, 2024  1644 This is a 78 year old gentleman who is on home hospice presenting in the company of his family with concern for  possible aspiration.  They report that the patient was having a choking spell while outside in a wheelchair today with a family member.  He does not wear oxygen at home.  He was struggling to breathe afterwards and brought him into the ER.  The patient's wife and son are at the bedside and the preference is to try to maximize going home and minimize hospitalization if possible.  But they are amenable to IV treatments in the hospital.  His son reports the patient drinks very little water, and typically his hydration only comes through eating watermelon.  Patient thankfully is not hypoxic, is 94% on room air.  He does not have audible wheezing but he does have a rhonchorous lower breath sounds.  Blood pressure is within normal limits.  He does have dementia and is level 5 caveat.  History is provided by his family.  He is pending chest x-ray and labs to evaluate for aspiration and dehydration.  I do suspect that he is clinically dehydrated and he is receiving a liter of fluids at this time. [MT]  EL.DUVERNEY Paged cardiologist [MT]  1946 Dr Floretta cardiologist recommending IV heparin , medical admission, no emergent plans for cath, echo in AM, cards will consult.  Family agrees and prefers hospitalization at this point [MT]  2058 Following initial plans for hospitalization, the hospital doctor had spoken to the family about concerns about losing hospice services after hospitalization.  They do not want this to happen. Instead, they have opted for [facility] inpatient hospice, which has accepted the patient and I gave report. They understand that we will not be pursuing medical workup and this is designated comfort care only [MT]    Clinical Course User Index [MT] Demetrica Zipp, Donnice PARAS, MD                                 Medical Decision Making Amount and/or Complexity of Data Reviewed Labs: ordered. Radiology: ordered. ECG/medicine tests: ordered.  Risk Prescription drug management. Decision regarding  hospitalization.   I personally viewed interpret the patient's labs and imaging.  Lactate is elevated but I suspect is related to dehydration.  White blood cell count is normal.  Doubt sepsis.  There is crowding noted on chest x-ray which limits views, but it is possibly aspirated.   CT PE study showed no acute PE.  Patient's labs were concerning for elevated troponins.  Repeat EKGs did not show developing or acute ischemia.  There was concern about potential subclinical developing MI, although the patient  has no active chest pain at this time.  The case was discussed with cardiology by phone, see ED course.  Initial plan was to initiate IV heparin and admit to the hospital for further workup.  However, given family concerns for losing hospice services, if pursuing curative treatment, we subsequently reached out to [facility] to pursue inpatient hospice.  That agency reported back to me that he was not a candidate at this time due to not requiring their needs for inpatient hospice.  Thus the patient will be admitted to the hospital tonight for PALLIATIVE management, observation.  We have not initiated heparin as family wants to prioritize keeping hospice benefits and not curative treatment.      Final diagnoses:  NSTEMI (non-ST elevated myocardial infarction) [facility])    ED Discharge Orders     None          Cottie Donnice PARAS, MD 11/05/24 2205

## 2024-11-05 NOTE — Progress Notes (Signed)
 PHARMACY - ANTICOAGULATION CONSULT NOTE  Pharmacy Consult for Heparin Indication: chest pain/ACS  Allergies  Allergen Reactions   Penicillins Anaphylaxis and Other (See Comments)    Years when he was in college approximately 78 years old    Patient Measurements:    Vital Signs: Temp: 98.3 F (36.8 C) (11/20 1852) Temp Source: Oral (11/20 1852) BP: 97/76 (11/20 1922) Pulse Rate: 71 (11/20 1825)  Labs: Recent Labs    11/05/24 1645  HGB 15.3  HCT 46.5  PLT 162  CREATININE 1.08    CrCl cannot be calculated (Unknown ideal weight.).   Medical History: Past Medical History:  Diagnosis Date   Dementia (HCC)    Renal disease     Medications:  (Not in a hospital admission)   Assessment: Pharmacy is consulted to dose heparin in 78 yo male diagnosed with ACS. No noted anticoagulation PTA.   Today, 11/05/24  Hgb 15.3, plt 162  Scr 1.08 mg/dl, Crcl 59 ml/min    Goal of Therapy:  HL 0.3-0.7    Plan:  Heparin 4000 unit bolus followed heparin 1000 units/hr Obtain HL 8 hours after start of infusion Daily CBC while one heparin Monitor for signs and symptoms of bleeding  Dolphus Roller, PharmD, BCPS 11/05/2024 8:33 PM

## 2024-11-05 NOTE — ED Triage Notes (Signed)
 BIB EMS from home for sob according to family who are poor historians. Per family, patient was outside and became sob, brought him inside and gave a neb then called EMS. Unsure of patient's baseline. Occasionally answers questions.

## 2024-11-05 NOTE — Progress Notes (Signed)
 [facility] [place] [address] Hospice liaison note     This patient is a current hospice patient with [facility]. Please see media tab for detailed hospice report.    Liaison will continue to follow for any discharge planning needs and to coordinate continuation of hospice care.    Please don't hesitate to call with any Hospice related questions or concerns.    Thank you for the opportunity to participate in this patient's care.   Eleanor Nail, LPN [facility] Liaison [phone]

## 2024-11-06 ENCOUNTER — Other Ambulatory Visit: Payer: Self-pay

## 2024-11-06 DIAGNOSIS — R072 Precordial pain: Secondary | ICD-10-CM | POA: Diagnosis not present

## 2024-11-06 MED ORDER — FENTANYL CITRATE (PF) 50 MCG/ML IJ SOSY
25.0000 ug | PREFILLED_SYRINGE | INTRAMUSCULAR | Status: DC | PRN
Start: 2024-11-06 — End: 2024-11-06

## 2024-11-06 NOTE — Discharge Summary (Signed)
 Physician Discharge Summary  Patient ID: SUNG PARODI MRN: [id number] DOB/AGE: 1946/03/14 78 y.o.  Admit date: 11/05/2024 Discharge date: 11/06/2024  Admission Diagnoses:  Discharge Diagnoses:  Principal Problem:   Chest pain   Discharged Condition: {condition:18240}  Hospital Course: ***  Consults: {consultation:18241}  Significant Diagnostic Studies: {diagnostics:18242}  Treatments: {Tx:18249}  Discharge Exam: Blood pressure 111/72, pulse (!) 55, temperature 98 F (36.7 C), resp. rate 18, height 5' 8 (1.727 m), weight 86.4 kg, SpO2 96%. {physical zkjf:6958869}  Disposition: Discharge disposition: 50-Hospice/Home       Discharge Instructions     Diet - low sodium heart healthy   Complete by: As directed    Increase activity slowly   Complete by: As directed       Allergies as of 11/06/2024       Reactions   Penicillins Anaphylaxis, Other (See Comments)   Years when he was in college approximately 78 years old        Medication List     STOP taking these medications    donepezil  10 MG tablet Commonly known as: ARICEPT    memantine  10 MG tablet Commonly known as: NAMENDA        TAKE these medications    acetaminophen 500 MG tablet Commonly known as: TYLENOL Take 500 mg by mouth in the morning.   albuterol (2.5 MG/3ML) 0.083% nebulizer solution Commonly known as: PROVENTIL Take 2.5 mg by nebulization every 6 (six) hours as needed for wheezing or shortness of breath.   LORazepam  1 MG tablet Commonly known as: Ativan  Take 1 tablet (1 mg total) by mouth at bedtime as needed for anxiety. What changed: when to take this   oxybutynin  5 MG tablet Commonly known as: DITROPAN  Take 5 mg by mouth in the morning and at bedtime.   pantoprazole  40 MG tablet Commonly known as: PROTONIX  Take 40 mg by mouth daily before breakfast.   senna 8.6 MG Tabs tablet Commonly known as: SENOKOT Take 2 tablets by mouth in the morning and at bedtime.          SignedBETHA Leatrice LILLETTE Rosario 11/06/2024, 3:23 PM

## 2024-11-06 NOTE — Progress Notes (Signed)
 AVS reviewed with patient and family at the bedside. All questions answered, and patient verbalized understanding. IV removed per order without complications. Patient to bed discharged home via ambulance transport.

## 2024-11-06 NOTE — Progress Notes (Signed)
 Robert Hester [address] [facility] Liaison note   Robert Hester is a current hospice patient with a terminal diagnosis of Alzheimer's disease. Family reported that the patient experienced an episode of aspiration and SOB at home on 11.20.25. EMS was activated and patient was transported to [facility] ED prior to [facility] being notified. He was admitted on 11.20.25 for NSTEMI. Per Dr. Ephriam Hester with [facility], this is a related hospital admission. Patient previously a full code, now DNR per hospital chart.   Met with patient at bedside and family in the hallway. Patient awakens when spoken to, seems pleasantly confused and drifts back to sleep. Family has many questions about hospital course and what hospice will and will not cover. All questions answered. Family is anxious to speak with inpatient team then wants to get the patient back home as soon as possible.   Pt is inpatient appropriate due to need for skilled monitoring and workup for NSTEMI and ACS.                   VS: 98.8/54/19   96/75   93% on RA   I/O: 123/not recorded   Abnormal Labs:  11/05/24  Lactic Acid, Venous: 2.7 (HH) Troponin T High Sensitivity: 307 (HH) Lactic Acid, Venous: 3.4 (HH) Potassium: 3.4 (L) Chloride: 112 (H) Calcium: 8.6 (L) Troponin T High Sensitivity: 166 (HH) 11/05/24  Urinalysis Appearance: HAZY ! Color, Urine: AMBER ! Ketones, ur: 5 ! Bacteria, UA: RARE ! Hyaline Casts, UA: PRESENT Mucus: PRESENT  Diagnostics:  CT ANGIOGRAPHY CHEST WITH CONTRAST IMPRESSION: 1. No evidence of pulmonary embolism or other acute intrathoracic process. 2. Multiple ill-defined areas of parenchymal low attenuation within the liver, likely representing hepatic cysts versus hemangiomas. Correlation with nonemergent hepatic ultrasound is recommended.   2 VIEW(S) XRAY OF THE CHEST 11/05/2024 05:26:00 PM   COMPARISON: 06/25/2024 IMPRESSION: 1. Low lung volumes with vascular crowding.    IV/PRN meds: lactated ringers  1,000 mL bolus IV x1, heparin  10 mL/hr 1,000 units/hr cont. IV   Assessment and Plan (per Robert Charleston, MD 11.21.25): Assessment/Plan Principal Problem:   Chest pain   # Chest pain # Refractory cough - Workup in emergency department concerning for ACS - Chest pain well-managed on admission - Patient's comfort care per family wishes to pursue comfort measures   Plan: Continue IV fentanyl  As needed Ativan  Discontinue all labs hospice to evaluate patient in morning   # GERD-continue Protonix    # Urinary tension-continue oxybutynin   Discharge Planning: ongoing, home when medically stable  Family Contact: spoke with wife and son  IDT: updated   Goals of Care: now DNR  Medication List and Transfer Summary uploaded to patient chart.   Should patient need ambulance transport at discharge, please call [facility] as we contract with them for our active hospice patients.   Please call with any hospice questions or concerns.   Thank you, Robert Hester, [facility] Liaison [phone]

## 2024-11-06 NOTE — TOC Initial Note (Signed)
 Transition of Care [facility]) - Initial/Assessment Note    Patient Details  Name: Robert Hester MRN: [id number] Date of Birth: October 23, 1946  Transition of Care [facility]) CM/SW Contact:    Sonda Manuella Quill, RN Phone Number: 11/06/2024, 3:39 PM  Clinical Narrative:                 IP CM consulted for hospice; pt oriented x 1; spoke w/ spouse Robert Hester ([phone]) and son Robert Hester [phone]) at bedside; they said pt lives at home w/ [facility] hospice; they plan for him to return at d/c w/ continued services w/ agency; they requested ambulance for transport; d/c address confirmed: [address] [place], [place] [address]; insurance/PCP verified; they denied pt experiencing SDOH risks; pt has Rollator, BSC, hoyer, wheelchair, nebulizer; he does not have HH services or home oxygen; Robert Hester and Robert Hester, [facility] Liaisons notified via secure chat; [facility] called for transport at 154; spoke w/ operator # [phone]; she was notified this is an [facility] hospice pt; she verbalized understanding; Nuwo, RN notified; no IP CM needs.  Expected Discharge Plan: Home w Hospice Care Barriers to Discharge: No Barriers Identified   Patient Goals and CMS Choice Patient states their goals for this hospitalization and ongoing recovery are:: pt will return home w/ hospice per Robert Hester (spouse)          Expected Discharge Plan and Services   Discharge Planning Services: CM Consult Post Acute Care Choice: Resumption of Svcs/PTA Provider Living arrangements for the past 2 months: Single Family Home Expected Discharge Date: 11/06/24               DME Arranged: N/A DME Agency: NA       HH Arranged: NA HH Agency: NA        Prior Living Arrangements/Services Living arrangements for the past 2 months: Single Family Home Lives with:: Spouse Patient language and need for interpreter reviewed:: Yes Do you feel safe going back to the place where you live?: Yes      Need for Family Participation  in Patient Care: Yes (Comment) Care giver support system in place?: Yes (comment) Current home services: Other (comment), DME, Hospice (Rollator, BSC, hoyer, wheelchair, nebulizer; [facility]) Criminal Activity/Legal Involvement Pertinent to Current Situation/Hospitalization: No - Comment as needed  Activities of Daily Living   ADL Screening (condition at time of admission) Is the patient deaf or have difficulty hearing?: Yes Does the patient have difficulty seeing, even when wearing glasses/contacts?:  (UTA) Does the patient have difficulty concentrating, remembering, or making decisions?: Yes  Permission Sought/Granted Permission sought to share information with : Case Manager Permission granted to share information with : Yes, Verbal Permission Granted  Share Information with NAME: Case Manager     Permission granted to share info w Relationship: Robert Hester (spouse) [phone] / Robert Hester (son) [phone]     Emotional Assessment Appearance:: Appears stated age Attitude/Demeanor/Rapport: Unable to Assess Affect (typically observed): Unable to Assess Orientation: :  (unable to assess) Alcohol / Substance Use: Not Applicable Psych Involvement: No (comment)  Admission diagnosis:  NSTEMI (non-ST elevated myocardial infarction) [facility]) [I21.4] Chest pain [R07.9] Patient Active Problem List   Diagnosis Date Noted   Chest pain 11/05/2024   Actinic keratosis 06/08/2019   Abdominal pain 06/08/2019   Cataract 06/08/2019   Family history of malignant neoplasm of gastrointestinal tract 06/08/2019   Inguinal hernia 06/08/2019   Low back pain 06/08/2019   Malaise and fatigue 06/08/2019  Thrombocytopenia 06/08/2019   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   Memory loss 03/05/2016   PCP:  Seabron Lenis, MD Pharmacy:   [facility] [phone] - [place], [place] - [address] AT [facility] [address] [facility] [address] [place]  [place] [address] Phone: [phone] Fax: [phone]     Social Drivers of Health (SDOH) Social History: SDOH Screenings   Food Insecurity: No Food Insecurity (11/06/2024)  Housing: Low Risk  (11/06/2024)  Transportation Needs: No Transportation Needs (11/06/2024)  Utilities: Not At Risk (11/06/2024)  Tobacco Use: Low Risk  (06/25/2024)   SDOH Interventions: Food Insecurity Interventions: Intervention Not Indicated, Inpatient TOC Housing Interventions: Intervention Not Indicated, Inpatient TOC Transportation Interventions: Intervention Not Indicated, Inpatient TOC Utilities Interventions: Intervention Not Indicated, Inpatient TOC   Readmission Risk Interventions     No data to display

## 2024-11-06 NOTE — H&P (Signed)
 History and Physical    Robert Hester FMW:[id number] DOB: 1946-03-18 DOA: 11/05/2024  PCP: Seabron Lenis, MD   Chief Complaint: Chest pain  HPI: Robert Hester is a 78 y.o. male with medical history significant of dementia on hospice who presents emergency department due to chest pain/possible aspiration event and coughing.  Patient is currently enrolled in hospice.  He chest pain and cough refractory to home management so presented to the emergency department further assess.  On arrival he was afebrile and hemodynamically stable.  Labs were obtained which showed lactic acid 2.7, potassium 3.4, BNP 71, troponin 166, 307.  Patient was started on heparin  drip and cardiology was consulted.  He was also given IV fluids.  Upon admission was discovered that he was enrolled in hospice.  Reached out to his family who wished to maintain hospice status and comfort care.  We reached out to his hospice management team who recommended overnight admission for symptom control.   Review of Systems: Review of Systems  Constitutional: Negative.   HENT: Negative.    Eyes: Negative.   Respiratory: Negative.    Cardiovascular:  Positive for chest pain and PND.  Gastrointestinal: Negative.   Genitourinary: Negative.   Musculoskeletal: Negative.   Skin: Negative.   Neurological:  Positive for dizziness.  Endo/Heme/Allergies: Negative.   Psychiatric/Behavioral: Negative.       As per HPI otherwise 10 point review of systems negative.   Allergies  Allergen Reactions   Penicillins Anaphylaxis and Other (See Comments)    Years when he was in college approximately 78 years old    Past Medical History:  Diagnosis Date   Dementia (HCC)    Renal disease     Past Surgical History:  Procedure Laterality Date   APPENDECTOMY     EYE SURGERY       reports that he has never smoked. He has never used smokeless tobacco. He reports that he does not currently use alcohol. He reports current drug use. Drug:  Marijuana.  Family History  Problem Relation Age of Onset   Colon cancer Father     Prior to Admission medications   Medication Sig Start Date End Date Taking? Authorizing Provider  acetaminophen (TYLENOL) 500 MG tablet Take 500 mg by mouth in the morning.   Yes [provider]  albuterol (PROVENTIL) (2.5 MG/3ML) 0.083% nebulizer solution Take 2.5 mg by nebulization every 6 (six) hours as needed for wheezing or shortness of breath.   Yes [provider]  donepezil  (ARICEPT ) 10 MG tablet TAKE 1 TABLET(10 MG) BY MOUTH AT BEDTIME 01/24/24  Yes Jaffe, Adam R, DO  LORazepam  (ATIVAN ) 1 MG tablet Take 1 tablet (1 mg total) by mouth at bedtime as needed for anxiety. Patient taking differently: Take 1 mg by mouth every 4 (four) hours as needed for anxiety. 01/24/24  Yes Jaffe, Adam R, DO  oxybutynin  (DITROPAN ) 5 MG tablet Take 5 mg by mouth in the morning and at bedtime.   Yes [provider]  pantoprazole  (PROTONIX ) 40 MG tablet Take 40 mg by mouth daily before breakfast.   Yes [provider]  senna (SENOKOT) 8.6 MG TABS tablet Take 2 tablets by mouth in the morning and at bedtime.   Yes [provider]  memantine  (NAMENDA ) 10 MG tablet Take 1 tablet (10 mg total) by mouth 2 (two) times daily. Patient not taking: Reported on 11/05/2024 01/24/24   Skeet Juliene SAUNDERS, DO    Physical Exam: Vitals:   11/05/24  2100 11/05/24 2200 11/05/24 2253 11/05/24 2344  BP: (!) 115/58 96/63  114/83  Pulse: 62   64  Resp: 20 18  17   Temp:   98.3 F (36.8 C) 98.5 F (36.9 C)  TempSrc:   Oral   SpO2: 93%     Weight:      Height:       Physical Exam Constitutional:      Appearance: He is normal weight.  HENT:     Head: Normocephalic.     Nose: Nose normal.     Mouth/Throat:     Mouth: Mucous membranes are moist.     Pharynx: Oropharynx is clear.  Eyes:     Conjunctiva/sclera: Conjunctivae normal.     Pupils: Pupils are equal, round, and reactive to light.   Cardiovascular:     Rate and Rhythm: Normal rate and regular rhythm.     Pulses: Normal pulses.     Heart sounds: Normal heart sounds.  Pulmonary:     Effort: Pulmonary effort is normal.     Breath sounds: Normal breath sounds.  Abdominal:     General: Abdomen is flat. Bowel sounds are normal.  Musculoskeletal:        General: Normal range of motion.  Neurological:     General: No focal deficit present.     Mental Status: He is alert. Mental status is at baseline.        Labs on Admission: I have personally reviewed the patients's labs and imaging studies.  Assessment/Plan Principal Problem:   Chest pain   # Chest pain # Refractory cough - Workup in emergency department concerning for ACS - Chest pain well-managed on admission - Patient's comfort care per family wishes to pursue comfort measures  Plan: Continue IV fentanyl  As needed Ativan  Discontinue all labs hospice to evaluate patient in morning  # GERD-continue Protonix   # Urinary tension-continue oxybutynin     Admission status: Observation Med-Surg  Certification: The appropriate patient status for this patient is OBSERVATION. Observation status is judged to be reasonable and necessary in order to provide the required intensity of service to ensure the patient's safety. The patient's presenting symptoms, physical exam findings, and initial radiographic and laboratory data in the context of their medical condition is felt to place them at decreased risk for further clinical deterioration. Furthermore, it is anticipated that the patient will be medically stable for discharge from the hospital within 2 midnights of admission.     Lamar Dess MD [facility] If 7PM-7AM, please contact night-coverage [email or web address]  11/06/2024, 3:07 AM

## 2024-12-17 DEATH — deceased
